# Patient Record
Sex: Female | Born: 1990 | Race: Black or African American | Hispanic: No | Marital: Married | Smoking: Never smoker
Health system: Southern US, Community
[De-identification: ages and names within clinical notes are randomized; demographics above are authoritative.]

## PROBLEM LIST (undated history)

## (undated) DIAGNOSIS — F32A Depression, unspecified: Secondary | ICD-10-CM

## (undated) DIAGNOSIS — F419 Anxiety disorder, unspecified: Secondary | ICD-10-CM

## (undated) DIAGNOSIS — K219 Gastro-esophageal reflux disease without esophagitis: Secondary | ICD-10-CM

## (undated) HISTORY — PX: KNEE SURGERY: SHX244

## (undated) HISTORY — PX: WISDOM TOOTH EXTRACTION: SHX21

---

## 2009-01-20 ENCOUNTER — Emergency Department (HOSPITAL_BASED_OUTPATIENT_CLINIC_OR_DEPARTMENT_OTHER): Admission: EM | Admit: 2009-01-20 | Discharge: 2009-01-20 | Payer: Self-pay | Admitting: Emergency Medicine

## 2011-02-01 ENCOUNTER — Emergency Department (HOSPITAL_BASED_OUTPATIENT_CLINIC_OR_DEPARTMENT_OTHER)
Admission: EM | Admit: 2011-02-01 | Discharge: 2011-02-01 | Disposition: A | Discharge status: Home / Self Care | Payer: No Typology Code available for payment source | Attending: Emergency Medicine | Admitting: Emergency Medicine

## 2011-02-01 ENCOUNTER — Emergency Department (INDEPENDENT_AMBULATORY_CARE_PROVIDER_SITE_OTHER): Payer: No Typology Code available for payment source

## 2011-02-01 ENCOUNTER — Encounter: Payer: Self-pay | Admitting: *Deleted

## 2011-02-01 DIAGNOSIS — R52 Pain, unspecified: Secondary | ICD-10-CM

## 2011-02-01 DIAGNOSIS — Y92410 Unspecified street and highway as the place of occurrence of the external cause: Secondary | ICD-10-CM | POA: Insufficient documentation

## 2011-02-01 DIAGNOSIS — M549 Dorsalgia, unspecified: Secondary | ICD-10-CM

## 2011-02-01 DIAGNOSIS — F17200 Nicotine dependence, unspecified, uncomplicated: Secondary | ICD-10-CM | POA: Insufficient documentation

## 2011-02-01 DIAGNOSIS — M25569 Pain in unspecified knee: Secondary | ICD-10-CM

## 2011-02-01 MED ORDER — HYDROCODONE-ACETAMINOPHEN 5-500 MG PO TABS: 1.0000 | ORAL_TABLET | Freq: Four times a day (QID) | ORAL | Status: AC | PRN

## 2011-02-01 MED ORDER — HYDROCODONE-ACETAMINOPHEN 5-325 MG PO TABS
1.0000 | ORAL_TABLET | Freq: Once | ORAL | Status: AC
  Administered 2011-02-01: 1 via ORAL

## 2011-02-01 MED ORDER — HYDROCODONE-ACETAMINOPHEN 5-325 MG PO TABS
ORAL_TABLET | ORAL | Status: AC
  Filled 2011-02-01: qty 1
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
## 2011-02-02 NOTE — ED Provider Notes (Signed)
Medical screening examination/treatment/procedure(s) were performed by non-physician practitioner and as supervising physician I was immediately available for consultation/collaboration.  Geoffery [place], MD 02/02/11 1537

## 2011-09-15 ENCOUNTER — Encounter (HOSPITAL_BASED_OUTPATIENT_CLINIC_OR_DEPARTMENT_OTHER): Payer: Self-pay | Admitting: *Deleted

## 2011-09-15 ENCOUNTER — Emergency Department (HOSPITAL_BASED_OUTPATIENT_CLINIC_OR_DEPARTMENT_OTHER)
Admission: EM | Admit: 2011-09-15 | Discharge: 2011-09-16 | Disposition: A | Discharge status: Home / Self Care | Payer: 59 | Attending: Emergency Medicine | Admitting: Emergency Medicine

## 2011-09-15 DIAGNOSIS — M7989 Other specified soft tissue disorders: Secondary | ICD-10-CM

## 2011-09-15 DIAGNOSIS — F17200 Nicotine dependence, unspecified, uncomplicated: Secondary | ICD-10-CM | POA: Insufficient documentation

## 2011-09-15 NOTE — ED Notes (Signed)
Pt c/o unable to get ring off finger

## 2011-09-16 NOTE — ED Notes (Signed)
Removed ring with ring cutter per RN

## 2011-09-16 NOTE — Discharge Instructions (Signed)
Place ice on finger as needed for swelling.  RESOURCE GUIDE  Dental Problems  Patients with Medicaid: [facility] [phone]                                           [phone] [address] Phone:  [phone]                                                   Phone:  [phone]  If unable to pay or uninsured, contact:  [facility] or [facility]. to become qualified for the adult dental clinic.  Chronic Pain Problems Contact [facility]  [phone] Patients need to be referred by their primary care doctor.  Insufficient Money for Medicine Contact [employer]:  call "211" or [facility] 7278848390.  No Primary Care Doctor Call [facility]  [phone] Other agencies that provide inexpensive medical care    [facility]  [phone]    [facility]  [phone]    [facility]  [phone]    [facility]  [phone]    [facility]  [phone]    [facility]  [phone]  Psychological Services [facility]  [phone] [facility]  [phone] [facility]   [phone] (emergency services [phone])  Abuse/Neglect [facility] Child Abuse Hotline 212 268 6307 [facility] Child Abuse Hotline [phone] (After Hours)  Emergency Shelter [facility] 7044057457  [facility] [place] [facility] [phone] [facility] [phone]  MRSA Hotline #:   720 390 0933    [facility] Resources  [facility]  [employer]                           [facility] Dept. [address] [place]                     [address]         [address]  Blondell Reveal Phone:  [phone]                                  Phone:   [phone]                   Phone:  [phone]  [facility] Phone:  [phone]  [facility] Child Abuse Hotline [phone] (After Hours)

## 2011-09-16 NOTE — ED Provider Notes (Signed)
History     CSN: [id number]  Arrival date & time 09/15/11  2349   First MD Initiated Contact with Patient 09/16/11 0048      Chief Complaint  Patient presents with  . Ring removal     (Consider location/radiation/quality/duration/timing/severity/associated sxs/prior treatment) HPI  21yof previously healthy presents with left ring finger swelling. The patient states that she placed a ring on her finger and immediately was unable to get it off. Her significant other tried to use pliers to get off. She began to have left ring finger swelling at that point. She denies numbness, tingling, weakness of her fingers. The ring was removed in triage prior to my evaluation. She states that her finger feels better and she is not having any pain at this time.   ED Notes, ED Provider Notes from 09/15/11 0000 to 09/15/11 23:53:38       Hennie Duos, RN 09/15/2011 23:52      Pt c/o unable to get ring off finger    History reviewed. No pertinent past medical history.  Past Surgical History  Procedure Date  . Knee surgery     History reviewed. No pertinent family history.  History  Substance Use Topics  . Smoking status: Current Everyday Smoker  . Smokeless tobacco: Not on file  . Alcohol Use: Yes    OB History    Grav Para Term Preterm Abortions TAB SAB Ect Mult Living                  Review of Systems  All other systems reviewed and are negative.   except as noted HPI   Allergies  Review of patient's allergies indicates no known allergies.  Home Medications  No current outpatient prescriptions on file.  BP 125/95  Pulse 91  Temp 98.3 F (36.8 C) (Oral)  Resp 16  Ht 5' (1.524 m)  Wt 104 lb (47.174 kg)  BMI 20.31 kg/m2  SpO2 100%  LMP 09/01/2011  Physical Exam  Nursing note and vitals reviewed. Constitutional: She is oriented to person, place, and time. She appears well-developed.  HENT:  Head: Atraumatic.  Mouth/Throat: Oropharynx is clear and moist.    Eyes: Conjunctivae and EOM are normal. Pupils are equal, round, and reactive to light.  Neck: Normal range of motion. Neck supple.  Cardiovascular: Normal rate, regular rhythm, normal heart sounds and intact distal pulses.   Pulmonary/Chest: Effort normal and breath sounds normal. No respiratory distress. She has no wheezes. She has no rales.  Abdominal: Soft. She exhibits no distension. There is no tenderness. There is no rebound and no guarding.  Musculoskeletal: Normal range of motion. She exhibits edema.       Ring finger with minimal swelling between PIP and MCP. There is no tenderness to palpation. There is no erythema. Gross sensation intact. Capillary refill less than 3.  Neurological: She is alert and oriented to person, place, and time.  Skin: Skin is warm and dry. No rash noted.  Psychiatric: She has a normal mood and affect.    ED Course  Procedures (including critical care time)  Labs Reviewed - No data to display No results found.   1. Finger swelling     MDM  Ring tourniquet, removed prior to my evaluation. Neurovascularly intact to follow with primary care as needed.        Forbes Cellar, MD 09/16/11 1324

## 2017-07-27 ENCOUNTER — Emergency Department (HOSPITAL_BASED_OUTPATIENT_CLINIC_OR_DEPARTMENT_OTHER): Payer: BLUE CROSS/BLUE SHIELD

## 2017-07-27 ENCOUNTER — Encounter (HOSPITAL_BASED_OUTPATIENT_CLINIC_OR_DEPARTMENT_OTHER): Payer: Self-pay

## 2017-07-27 ENCOUNTER — Emergency Department (HOSPITAL_BASED_OUTPATIENT_CLINIC_OR_DEPARTMENT_OTHER)
Admission: EM | Admit: 2017-07-27 | Discharge: 2017-07-27 | Disposition: A | Discharge status: Home / Self Care | Payer: BLUE CROSS/BLUE SHIELD | Attending: Emergency Medicine | Admitting: Emergency Medicine

## 2017-07-27 ENCOUNTER — Other Ambulatory Visit: Payer: Self-pay

## 2017-07-27 DIAGNOSIS — J0100 Acute maxillary sinusitis, unspecified: Secondary | ICD-10-CM

## 2017-07-27 DIAGNOSIS — M542 Cervicalgia: Secondary | ICD-10-CM | POA: Insufficient documentation

## 2017-07-27 DIAGNOSIS — J029 Acute pharyngitis, unspecified: Secondary | ICD-10-CM | POA: Diagnosis present

## 2017-07-27 DIAGNOSIS — R52 Pain, unspecified: Secondary | ICD-10-CM

## 2017-07-27 LAB — CBC WITH DIFFERENTIAL/PLATELET
BASOS ABS: 0 10*3/uL (ref 0.0–0.1)
Basophils Relative: 0 %
EOS ABS: 0 10*3/uL (ref 0.0–0.7)
EOS PCT: 0 %
HCT: 36.8 % (ref 36.0–46.0)
Hemoglobin: 13.6 g/dL (ref 12.0–15.0)
Lymphocytes Relative: 6 %
Lymphs Abs: 0.3 10*3/uL — ABNORMAL LOW (ref 0.7–4.0)
MCH: 28.6 pg (ref 26.0–34.0)
MCHC: 37 g/dL — ABNORMAL HIGH (ref 30.0–36.0)
MCV: 77.5 fL — ABNORMAL LOW (ref 78.0–100.0)
Monocytes Absolute: 0.4 10*3/uL (ref 0.1–1.0)
Monocytes Relative: 8 %
Neutro Abs: 4.1 10*3/uL (ref 1.7–7.7)
Neutrophils Relative %: 86 %
PLATELETS: 231 10*3/uL (ref 150–400)
RBC: 4.75 MIL/uL (ref 3.87–5.11)
RDW: 13.2 % (ref 11.5–15.5)
WBC: 4.8 10*3/uL (ref 4.0–10.5)

## 2017-07-27 LAB — BASIC METABOLIC PANEL
Anion gap: 9 (ref 5–15)
BUN: 6 mg/dL (ref 6–20)
CO2: 23 mmol/L (ref 22–32)
CREATININE: 0.61 mg/dL (ref 0.44–1.00)
Calcium: 8.9 mg/dL (ref 8.9–10.3)
Chloride: 104 mmol/L (ref 101–111)
GFR calc Af Amer: >60 mL/min (ref >60)
Glucose, Bld: 85 mg/dL (ref 65–99)
Potassium: 3.3 mmol/L — ABNORMAL LOW (ref 3.5–5.1)
SODIUM: 136 mmol/L (ref 135–145)

## 2017-07-27 LAB — URINALYSIS, ROUTINE W REFLEX MICROSCOPIC
Bilirubin Urine: NEGATIVE
Glucose, UA: NEGATIVE mg/dL
KETONES UR: 15 mg/dL — AB
Leukocytes, UA: NEGATIVE
NITRITE: NEGATIVE
PH: 7.5 (ref 5.0–8.0)
Protein, ur: NEGATIVE mg/dL
Specific Gravity, Urine: 1.015 (ref 1.005–1.030)

## 2017-07-27 LAB — MONONUCLEOSIS SCREEN: Mono Screen: NEGATIVE

## 2017-07-27 LAB — URINALYSIS, MICROSCOPIC (REFLEX)

## 2017-07-27 LAB — PREGNANCY, URINE: PREG TEST UR: NEGATIVE

## 2017-07-27 LAB — RAPID STREP SCREEN (MED CTR MEBANE ONLY): STREPTOCOCCUS, GROUP A SCREEN (DIRECT): NEGATIVE

## 2017-07-27 MED ORDER — IBUPROFEN 600 MG PO TABS: 600.0000 mg | ORAL_TABLET | Freq: Four times a day (QID) | ORAL | 0 refills | Status: DC | PRN

## 2017-07-27 MED ORDER — ACETAMINOPHEN 500 MG PO TABS
1000.0000 mg | ORAL_TABLET | Freq: Once | ORAL | Status: AC
  Administered 2017-07-27: 1000 mg via ORAL
  Filled 2017-07-27: qty 2

## 2017-07-27 MED ORDER — GUAIFENESIN ER 1200 MG PO TB12: 1.0000 | ORAL_TABLET | Freq: Two times a day (BID) | ORAL | 1 refills | Status: DC | PRN

## 2017-07-27 MED ORDER — SODIUM CHLORIDE 0.9 % IV BOLUS
1000.0000 mL | Freq: Once | INTRAVENOUS | Status: AC
  Administered 2017-07-27: 1000 mL via INTRAVENOUS

## 2017-07-27 MED ORDER — AMOXICILLIN-POT CLAVULANATE 875-125 MG PO TABS: 1.0000 | ORAL_TABLET | Freq: Two times a day (BID) | ORAL | 0 refills | Status: DC

## 2017-07-27 MED FILL — MUCINEX ER 1,200 MG TABLET: 1200 | 14 days supply | Qty: 28 | Fill #0

## 2017-07-27 MED FILL — AMOX-CLAV 875-125 MG TABLET: 875-125 | 7 days supply | Qty: 14 | Fill #0

## 2017-07-27 MED FILL — IBUPROFEN 600 MG TABLET: 600 | 8 days supply | Qty: 30 | Fill #0

## 2017-07-27 NOTE — Discharge Instructions (Signed)
You lab work and chest xray are reassuring.  Please take all of your antibiotics until finished!   You may develop abdominal discomfort or diarrhea from the antibiotic.  You may help offset this with probiotics which you can buy or get in yogurt. Do not eat or take the probiotics until 2 hours after your antibiotic. Do not take your medicine if develop an itchy rash, swelling in your mouth or lips, or difficulty breathing.  Please follow up with your PCP this week.  If you develop any rash, stiff neck worsening or new concerning symptoms you can return to the emergency department for re-evaluation.

## 2017-07-27 NOTE — ED Triage Notes (Signed)
Pt c/o bodyaches, sore throat, chest/back pain, headache x2wks; saw a doctor on Monday and did a chest/back xray and they are testing for fibromyalgia

## 2017-07-27 NOTE — ED Provider Notes (Signed)
[facility] EMERGENCY DEPARTMENT Provider Note   CSN: [id number] Arrival date & time: 07/27/17  1009     History   Chief Complaint Chief Complaint  Patient presents with  . Generalized Body Aches    HPI Morgan Sanchez is a 27 y.o. female with no significant past medical history presents emergency department today for nasal congestion, sinus pressure, sore throat, cough and generalized body aches.  Patient notes that over the last 2 weeks she has had generalized body aches, feeling more tired as well as sore throat.  She notes that over the last 1.5 weeks she has been developing nasal congestion, sinus pressure, worsening sore throat with associated dysphasia, sneezing as well as now a dry, nonproductive cough.  She was seen by her PCP and had a chest x-ray that was negative.  She was set up to have blood work for fibromyalgia.  She notes that she has been taking ibuprofen as well as Alka-Seltzer Liqui-Gels for her symptoms without relief.  No antipyretics prior to arrival.  She notes she started running a fever over the last 2-3 days, with a T-max of 101.  She reports that she has not been outside recently and denies any tick bites.  She does not have animals at home that could transport to extend side.  She denies any headache, focal weakness, rash, neck stiffness, inability to control secretions, chest pain, shortness of breath, hemoptysis, lower leg swelling, abdominal pain, nausea/vomiting/diarrhea, urinary frequency, urinary urgency, dysuria, hematuria.  HPI  History reviewed. No pertinent past medical history.  There are no active problems to display for this patient.   Past Surgical History:  Procedure Laterality Date  . KNEE SURGERY       OB History   None      Home Medications    Prior to Admission medications   Not on File    Family History No family history on file.  Social History Social History   Tobacco Use  . Smoking status: Never Smoker  .  Smokeless tobacco: Never Used  Substance Use Topics  . Alcohol use: Yes  . Drug use: No     Allergies   Patient has no known allergies.   Review of Systems Review of Systems  All other systems reviewed and are negative.    Physical Exam Updated Vital Signs BP (!) 143/88 (BP Location: Right Arm)   Pulse 84   Temp 98.8 F (37.1 C) (Oral)   Resp 18   LMP 07/20/2017   SpO2 100%   Physical Exam  Constitutional: She appears well-developed and well-nourished.  HENT:  Head: Normocephalic and atraumatic.  Right Ear: Tympanic membrane and external ear normal.  Left Ear: Tympanic membrane and external ear normal.  Nose: Mucosal edema present. Right sinus exhibits maxillary sinus tenderness. Right sinus exhibits no frontal sinus tenderness. Left sinus exhibits maxillary sinus tenderness. Left sinus exhibits no frontal sinus tenderness.  Mouth/Throat: Uvula is midline, oropharynx is clear and moist and mucous membranes are normal. No tonsillar exudate.  The patient has normal phonation and is in control of secretions. No stridor.  Midline uvula without edema. Soft palate rises symmetrically. Tonsillar erythema without exudates. No PTA. Tongue protrusion is normal. No trismus. No creptius on neck palpation and patient has good dentition. No gingival erythema or fluctuance noted. Mucus membranes moist.   Eyes: Pupils are equal, round, and reactive to light. Right eye exhibits no discharge. Left eye exhibits no discharge. No scleral icterus.  Neck: Trachea normal.  Neck supple. No JVD present. No spinous process tenderness present. Carotid bruit is not present. No neck rigidity. Normal range of motion present.  No nuchal rigidity or meningismus  Cardiovascular: Normal rate, regular rhythm and intact distal pulses.  No murmur heard. Pulses:      Radial pulses are 2+ on the right side, and 2+ on the left side.       Dorsalis pedis pulses are 2+ on the right side, and 2+ on the left side.        Posterior tibial pulses are 2+ on the right side, and 2+ on the left side.  No lower extremity swelling or edema. Calves symmetric in size bilaterally.  Pulmonary/Chest: Effort normal and breath sounds normal. She exhibits no tenderness.  No increased work of breathing. No accessory muscle use. Patient is sitting upright, speaking in full sentences without difficulty   Abdominal: Soft. Bowel sounds are normal. She exhibits no distension. There is no tenderness. There is no rigidity, no rebound, no guarding and no CVA tenderness.  Musculoskeletal: She exhibits no edema.  Lymphadenopathy:    She has cervical adenopathy (anterior).  Neurological: She is alert.  Speech clear. Follows commands. No facial droop. PERRLA. EOM grossly intact. CN III-XII grossly intact. Grossly moves all extremities 4 without ataxia. Able and appropriate strength for age to upper and lower extremities bilaterally.   Skin: Skin is warm and dry. No petechiae, no purpura and no rash noted. She is not diaphoretic.  Psychiatric: She has a normal mood and affect.  Nursing note and vitals reviewed.    ED Treatments / Results  Labs (all labs ordered are listed, but only abnormal results are displayed) Labs Reviewed  BASIC METABOLIC PANEL - Abnormal; Notable for the following components:      Result Value   Potassium 3.3 (*)    All other components within normal limits  CBC WITH DIFFERENTIAL/PLATELET - Abnormal; Notable for the following components:   MCV 77.5 (*)    MCHC 37.0 (*)    Lymphs Abs 0.3 (*)    All other components within normal limits  URINALYSIS, ROUTINE W REFLEX MICROSCOPIC - Abnormal; Notable for the following components:   Hgb urine dipstick SMALL (*)    Ketones, ur 15 (*)    All other components within normal limits  URINALYSIS, MICROSCOPIC (REFLEX) - Abnormal; Notable for the following components:   Bacteria, UA FEW (*)    All other components within normal limits  RAPID STREP SCREEN ([facility] & [facility]  ONLY)  CULTURE, GROUP A STREP [facility])  MONONUCLEOSIS SCREEN  PREGNANCY, URINE    EKG None  Radiology Dg Chest 2 View  Result Date: 07/27/2017 CLINICAL DATA:  Body aches.  Sore throat.  Chest and back pain. EXAM: CHEST - 2 VIEW COMPARISON:  None. FINDINGS: The heart size and mediastinal contours are within normal limits. Both lungs are clear. The visualized skeletal structures are unremarkable. IMPRESSION: No active cardiopulmonary disease. Electronically Signed   By: San Morelle M.D.   On: 07/27/2017 12:05    Procedures Procedures (including critical care time)  Medications Ordered in ED Medications  sodium chloride 0.9 % bolus 1,000 mL (has no administration in time range)  acetaminophen (TYLENOL) tablet 1,000 mg (has no administration in time range)     Initial Impression / Assessment and Plan / ED Course  I have reviewed the triage vital signs and the nursing notes.  Pertinent labs & imaging results that were available during my care of the  patient were reviewed by me and considered in my medical decision making (see chart for details).     27 y.o. female with no significant past medical history presents emergency department today for fever, nasal congestion, sinus pressure, sore throat, non-productive cough and generalized body aches that have been ongoing for the last several weeks. Patient is noted to have fever and mild tachycardia on arrival. This improved after tylenol and ivf.  Patient ear exam without evidence of AOM. No meningeal signs  No evidence of mastoiditis. Patient does have significant mucosal edema and sinus pressure. Oropharynx is with mild tonsillar erythema but no exudates. Strep and mono negative. Do not suspect PTA or RPA. Patient without meningeal signs, ha, neurologic symptoms or rash. Do not suspect meningitis. Lungs are clear to auscultation bilaterally. Xray without PNA.   Abdomen is soft, nondistended and without tenderness. Do not suspect  intra-abdominal pathology. Patient UA without evidence of UTI. Screening labs otherwise reassuring as above. Patient denies tick bites, rash, outdoor activities or animal in house that could transfer ticks that would make me concerned for RMSF. Concern for acute bacterial rhinosinusitis.  Patient discharged with Augmentin.  Instructions given for warm saline nasal wash given. I advised the patient to follow-up with PCP this week. Specific return precautions discussed. Time was given for all questions to be answered. The patient verbalized understanding and agreement with plan. The patient appears safe for discharge home.  Final Clinical Impressions(s) / ED Diagnoses   Final diagnoses:  Acute non-recurrent maxillary sinusitis    ED Discharge Orders        Ordered    amoxicillin-clavulanate (AUGMENTIN) 875-125 MG tablet  Every 12 hours     07/27/17 1237    Guaifenesin (MUCINEX MAXIMUM STRENGTH) 1200 MG TB12  Every 12 hours PRN     07/27/17 1237    ibuprofen (ADVIL,MOTRIN) 600 MG tablet  Every 6 hours PRN     07/27/17 1237       Lorelle Gibbs 07/27/17 1238    Isla Pence, MD 07/27/17 1248

## 2017-07-30 LAB — CULTURE, GROUP A STREP (THRC)

## 2020-12-23 ENCOUNTER — Inpatient Hospital Stay (HOSPITAL_COMMUNITY): Payer: BC Managed Care – PPO

## 2020-12-23 ENCOUNTER — Other Ambulatory Visit: Payer: Self-pay

## 2020-12-23 ENCOUNTER — Encounter (HOSPITAL_COMMUNITY): Payer: Self-pay | Admitting: Obstetrics and Gynecology

## 2020-12-23 ENCOUNTER — Inpatient Hospital Stay (HOSPITAL_COMMUNITY)
Admission: AD | Admit: 2020-12-23 | Discharge: 2020-12-23 | Disposition: A | Discharge status: Home / Self Care | Payer: BC Managed Care – PPO | Attending: Obstetrics and Gynecology | Admitting: Obstetrics and Gynecology

## 2020-12-23 DIAGNOSIS — Z3A01 Less than 8 weeks gestation of pregnancy: Secondary | ICD-10-CM | POA: Diagnosis not present

## 2020-12-23 DIAGNOSIS — O210 Mild hyperemesis gravidarum: Secondary | ICD-10-CM | POA: Insufficient documentation

## 2020-12-23 DIAGNOSIS — O219 Vomiting of pregnancy, unspecified: Secondary | ICD-10-CM

## 2020-12-23 DIAGNOSIS — Z3A00 Weeks of gestation of pregnancy not specified: Secondary | ICD-10-CM

## 2020-12-23 DIAGNOSIS — Z3200 Encounter for pregnancy test, result unknown: Secondary | ICD-10-CM

## 2020-12-23 HISTORY — DX: Depression, unspecified: F32.A

## 2020-12-23 HISTORY — DX: Anxiety disorder, unspecified: F41.9

## 2020-12-23 LAB — URINALYSIS, ROUTINE W REFLEX MICROSCOPIC
Bilirubin Urine: NEGATIVE
Glucose, UA: NEGATIVE mg/dL
Hgb urine dipstick: NEGATIVE
Ketones, ur: 80 mg/dL — AB
Leukocytes,Ua: NEGATIVE
Nitrite: NEGATIVE
Protein, ur: NEGATIVE mg/dL
Specific Gravity, Urine: 1.016 (ref 1.005–1.030)
pH: 5 (ref 5.0–8.0)

## 2020-12-23 LAB — POCT PREGNANCY, URINE: Preg Test, Ur: POSITIVE — AB

## 2020-12-23 MED ORDER — SODIUM CHLORIDE 0.9 % IV SOLN
8.0000 mg | Freq: Once | INTRAVENOUS | Status: AC
  Administered 2020-12-23: 8 mg via INTRAVENOUS
  Filled 2020-12-23: qty 4

## 2020-12-23 MED ORDER — LACTATED RINGERS IV BOLUS
1000.0000 mL | Freq: Once | INTRAVENOUS | Status: AC
  Administered 2020-12-23: 1000 mL via INTRAVENOUS

## 2020-12-23 MED ORDER — ONDANSETRON HCL 4 MG PO TABS
4.0000 mg | ORAL_TABLET | Freq: Three times a day (TID) | ORAL | 6 refills | Status: DC | PRN
Start: 2020-12-23 — End: 2021-08-19

## 2020-12-23 MED ORDER — M.V.I. ADULT IV INJ
Freq: Once | INTRAVENOUS | Status: AC
  Filled 2020-12-23: qty 10

## 2020-12-23 NOTE — Progress Notes (Signed)
Dr Garwin Brothers in earlier to discuss d/c plan with pt. Written and verbal d/c instructions given and understanding voiced.

## 2020-12-23 NOTE — MAU Provider Note (Signed)
Event Date/Time   First Provider Initiated Contact with Patient 12/23/20 1556      S [name] is a 30 y.o. G1P0 at [redacted]w[redacted]d gestation by certain LMP of 07/29/2021 female who presents to MAU today with complaint of feeling dehydrated, not being able to hold anything down since Friday and weakness. She is a patient of Dr. Garwin Brothers' and has already been seen for this pregnancy. She does not have any anti-emetic medications at home. She reports last eating mashed potatoes at 0900 this morning and was able to keep them down. She drank something about 1000 this morning, but was not able to keep that down.  O BP 116/61 (BP Location: Right Arm)   Pulse 85   Temp 98.1 F (36.7 C) (Oral)   Resp 19   Ht 5\' 1"  (1.549 m)   Wt 64.7 kg   LMP 11/02/2020 (Exact Date)   SpO2 100%   BMI 26.94 kg/m   Physical Exam Constitutional:      Appearance: She is normal weight.  Pulmonary:     Effort: Pulmonary effort is normal.  Genitourinary:    Comments: Not indicated Musculoskeletal:     Cervical back: Normal range of motion.  Neurological:     Mental Status: She is alert and oriented to person, place, and time.  Psychiatric:        Mood and Affect: Mood normal.        Behavior: Behavior normal.        Thought Content: Thought content normal.        Judgment: Judgment normal.   Results for orders placed or performed during the hospital encounter of 12/23/20 (from the past 24 hour(s))  Pregnancy, urine POC     Status: Abnormal   Collection Time: 12/23/20  3:59 PM  Result Value Ref Range   Preg Test, Ur POSITIVE (A) NEGATIVE    A Medical screening exam complete Morning sickness - [facility] - UPT - RN awaiting contact from Dr. Garwin Brothers for further orders  Morgan Sanchez, [place] 12/23/2020 4:27 PM

## 2020-12-23 NOTE — Progress Notes (Signed)
No answer, LVM.

## 2020-12-23 NOTE — MAU Note (Signed)
History     Chief Complaint  Patient presents with   Emesis   Nausea   30 yo G1P0 MBF ~ [redacted] week gestation presents with c/o inability to keep anything down since  Friday. Denies vaginal  bleeding or abdominal pain  OB History     Gravida  1   Para      Term      Preterm      AB      Living  0      SAB      IAB      Ectopic      Multiple      Live Births  0           Past Medical History:  Diagnosis Date   Anxiety    Depression     Past Surgical History:  Procedure Laterality Date   KNEE SURGERY      Family History  Problem Relation Age of Onset   Healthy Mother    Healthy Father     Social History   Tobacco Use   Smoking status: Never   Smokeless tobacco: Never  Vaping Use   Vaping Use: Never used  Substance Use Topics   Alcohol use: Not Currently   Drug use: No    Allergies: No Known Allergies  Medications Prior to Admission  Medication Sig Dispense Refill Last Dose   Prenatal Vit-Fe Fumarate-FA (MULTIVITAMIN-PRENATAL) 27-0.8 MG TABS tablet Take 1 tablet by mouth daily at 12 noon.   12/22/2020   amoxicillin-clavulanate (AUGMENTIN) 875-125 MG tablet Take 1 tablet by mouth every 12 (twelve) hours. 14 tablet 0    Guaifenesin (MUCINEX MAXIMUM STRENGTH) 1200 MG TB12 Take 1 tablet (1,200 mg total) by mouth every 12 (twelve) hours as needed. 14 tablet 1    ibuprofen (ADVIL,MOTRIN) 600 MG tablet Take 1 tablet (600 mg total) by mouth every 6 (six) hours as needed. 30 tablet 0      Physical Exam   Blood pressure 116/61, pulse 85, temperature 98.1 F (36.7 C), temperature source Oral, resp. rate 19, height 5\' 1"  (1.549 m), weight 64.7 kg, last menstrual period 11/02/2020, SpO2 100 %.  General appearance: alert, cooperative, and no distress Lungs: clear to auscultation bilaterally Breasts:  bilaterally tender, dense Heart: regular rate and rhythm, S1, S2 normal, no murmur, click, rub or gallop Abdomen:  soft non tender Pelvic deferred ED  Course  IMP: n/v in pregnancy P) IVF ( 2L). Zofran 8mg  IV 1st bag. 1 amp MVI with 2nd bag. Sonogram confirm viability MDM  Addendum US OB Comp Less 14 Wks  Result Date: 12/23/2020 CLINICAL DATA:  Nausea vomiting EXAM: OBSTETRIC <14 WK ULTRASOUND TECHNIQUE: Transabdominal ultrasound was performed for evaluation of the gestation as well as the maternal uterus and adnexal regions. COMPARISON:  None. FINDINGS: Intrauterine gestational sac: Single Yolk sac:  Visualized Embryo:  Visualized Cardiac Activity: Visualized Heart Rate: 135 bpm CRL:   8.3 mm   6 w 5 d                  [place] EDC: 08/13/2021 Subchorionic hemorrhage:  None visualized. Maternal uterus/adnexae: Nonvisualized left ovary. Right ovary measures 5 x 3.7 by 3.9 cm. IMPRESSION: Single viable intrauterine pregnancy with estimated sonographic age of 6 weeks 5 days and ultrasound EDC of 08/13/2021. Otherwise no specific abnormality is seen. Electronically Signed   By: Donavan Foil M.D.   On: 12/23/2020 17:55     Reviewed sonogram with pt  Able to tolerate crackers, juice P) d/c home. Reviewed food options, disc freq small meals Call office regarding appt Marvene Staff, MD 6:59 PM 12/23/2020

## 2020-12-23 NOTE — Progress Notes (Signed)
RN called MD again, no answer, LVM.

## 2020-12-23 NOTE — MAU Note (Signed)
Presents stating she's dehydrated because unable to keep anything down.  Reports N/V since Friday.

## 2021-01-19 LAB — OB RESULTS CONSOLE ANTIBODY SCREEN: Antibody Screen: NEGATIVE

## 2021-01-19 LAB — OB RESULTS CONSOLE GC/CHLAMYDIA
Chlamydia: NEGATIVE
Gonorrhea: NEGATIVE

## 2021-01-19 LAB — HEPATITIS C ANTIBODY: HCV Ab: NEGATIVE

## 2021-01-19 LAB — OB RESULTS CONSOLE ABO/RH: RH Type: POSITIVE

## 2021-01-19 LAB — OB RESULTS CONSOLE RUBELLA ANTIBODY, IGM: Rubella: IMMUNE

## 2021-01-19 LAB — OB RESULTS CONSOLE RPR: RPR: NONREACTIVE

## 2021-01-19 LAB — OB RESULTS CONSOLE HIV ANTIBODY (ROUTINE TESTING): HIV: NONREACTIVE

## 2021-01-19 LAB — OB RESULTS CONSOLE HEPATITIS B SURFACE ANTIGEN: Hepatitis B Surface Ag: NEGATIVE

## 2021-03-29 NOTE — L&D Delivery Note (Signed)
Vaginal Delivery Note - w/PPH  This G1, P0 with EDC 08/14/21 presented with SROM about 7.30 yesterday morning. Her prenatal care complicated by uterine fibroids, hemoglobin C trait, sialorrhea, IBS & anxiety. GBS negative. She was a transfer of care at 20 weeks. Exam upon admission: 2 tand by report pool of fluid was positive for Nitrazine and positive ferning.  She required augmentation with Pitocin to achieve a good active phase. AROM of forebag was performed and thin meconium was present. She achieved complete cervical dilation at 0438 on 08/15/21. Patient started pushing at 05:15. She pushed for approximately one hour after she was noted to be C/C/+2. Guided pushing with maternal urge and regular contractions. At 6:18 AM a viable and healthy female was delivered via Vaginal, Spontaneous (Presentation:Occiput Anterior).  APGAR: 7, 9; weight 7 lb 1.2 oz (3210 g).  After head was delivered no nuchal cord was identified and shoulders and body easily delivered.  Baby held head down to prevent inhalation of meconium stained amniotic fluid. Nose and mouth bulb suctioned thoroughly. Baby then laid on maternal abdomen, dried and tactile stimulation performed. Baby noted to have a vigorous cry and moving all four extremities. Delayed cord clamping done and cord cut by father. Cord blood obtained.   Pitocin bolus infused via pump protocol. Placenta spontaneously delivered complete and intact with fundal massage and general traction on the cord. Three vessels are noted. Fundus required bimanual massage in a couple of occasions for recurrent lower uterine segment atony with several larger clots.  Uterine sweep performed removing more clots. As the Pitocin infused and massage continued, the uterine tone improved significantly. EBL at this time was about 500 mL. 1 dose of Tranexamic acid administered and 1000 mcg of misoprostol administered per rectum prophylactically and one dose of 3 Grams of IV Unasyn (for uterine  sweep).   The uterine fundus appropriately firmed,however there was persistent bright red bleeding. Exam revealed a significant second-degree tear ascended a little bit up higher in the vagina and a little off to the right side but rectum sphincter were intact, although I cannot see good fascia around the sphincter anteriorly. The cervix was inspected and there was no laceration. A rectal examination was performed and the rectal sphincter was intact. Upon inspection a second degree laceration was identified as well as brisk bleeding from a left sulcus tear. Attempt was made to place sutures of #2 Vicryl Rapide along the line of the sulcal tear, however patient had acute bout of nausea with vomiting. The sutures began tearing through the friable mucosal tissue. 10 mg of IV metoclopramide administered to aid with the N/V.   Due to the persistent bleeding a postpartum hemorrhage was called. RN staff informed via I-SBAR that it was stage I.  qBL scale (Triton) was requested to be brought in.  Foley urethral catheter was re-placed for urinary output monitoring. Surgical tech provided additional lap pads. A running, locked suture of 2-0 Chromic was used to repair the bleeding sulcal tear and bleeding was controlled. Patient remained hemodynamically stable, and was alert, awake and oriented throughout.   The perineum was infiltrated with 1% Xylocaine. The remaining second degree laceration was repaired in routine fashion with 2-0 Vicryl, 2-0 Chromic and 3-0 Chromic.   Patient tolerated delivery well.Once this was complete, mom and baby doing well.    Delivery Details: Delivery Type: NSVD  Anesthesia  Epidural  and local 1% lidocaine  Episiotomy:  N/A  Lacerations:  Second degree perineal / left sulcus extending  to vaginal wall  Repair suture:  2-0 vicryl Rapide CT-1 (x3); 3-0-Chromic CT 1 (x1) and SH x1  Blood loss (ml):  930   Birth information: Date of birth:   [birth year]  Time of birth: 06:18  Sex:  Information for the patient's newborn:  Stauffer, Girl Erva [[id number]]  female    Name: "Lavendar Skye"  APGAR APGAR (1 MIN): 7   APGAR (5 MINS): 9   APGAR (10 MINS):    Weight  3210 grams (7# 1.2 oz)   Resuscitation:   Drying, stimulation, bulb suction  Cord information: 3 vessel cord Complications:     PPH  Placenta: Delivered: Spontaneous intact appearance: Normal     Disposition: Mom to postpartum.  Baby to Couplet care / Skin to Skin.  Sanjuana Kava MD 08/15/2021, 9:05 AM

## 2021-07-06 DIAGNOSIS — D250 Submucous leiomyoma of uterus: Secondary | ICD-10-CM | POA: Diagnosis not present

## 2021-07-06 DIAGNOSIS — Z3A34 34 weeks gestation of pregnancy: Secondary | ICD-10-CM | POA: Diagnosis not present

## 2021-07-06 DIAGNOSIS — Z3403 Encounter for supervision of normal first pregnancy, third trimester: Secondary | ICD-10-CM | POA: Diagnosis not present

## 2021-07-21 DIAGNOSIS — Z3403 Encounter for supervision of normal first pregnancy, third trimester: Secondary | ICD-10-CM | POA: Diagnosis not present

## 2021-07-21 LAB — OB RESULTS CONSOLE GBS: GBS: NEGATIVE

## 2021-08-04 DIAGNOSIS — Z3A38 38 weeks gestation of pregnancy: Secondary | ICD-10-CM | POA: Diagnosis not present

## 2021-08-04 DIAGNOSIS — D259 Leiomyoma of uterus, unspecified: Secondary | ICD-10-CM | POA: Diagnosis not present

## 2021-08-10 ENCOUNTER — Telehealth (HOSPITAL_COMMUNITY): Payer: Self-pay | Admitting: *Deleted

## 2021-08-10 ENCOUNTER — Encounter (HOSPITAL_COMMUNITY): Payer: Self-pay | Admitting: *Deleted

## 2021-08-10 ENCOUNTER — Other Ambulatory Visit: Payer: Self-pay | Admitting: Obstetrics & Gynecology

## 2021-08-10 NOTE — Telephone Encounter (Signed)
Preadmission screen  

## 2021-08-14 ENCOUNTER — Inpatient Hospital Stay (HOSPITAL_COMMUNITY): Payer: BC Managed Care – PPO | Admitting: Anesthesiology

## 2021-08-14 ENCOUNTER — Inpatient Hospital Stay (HOSPITAL_COMMUNITY)
Admission: AD | Admit: 2021-08-14 | Discharge: 2021-08-19 | DRG: 806 | Disposition: A | Discharge status: Home / Self Care | Payer: BC Managed Care – PPO | Attending: Obstetrics & Gynecology | Admitting: Obstetrics & Gynecology

## 2021-08-14 ENCOUNTER — Encounter (HOSPITAL_COMMUNITY): Payer: Self-pay | Admitting: Obstetrics & Gynecology

## 2021-08-14 ENCOUNTER — Other Ambulatory Visit: Payer: Self-pay

## 2021-08-14 DIAGNOSIS — D259 Leiomyoma of uterus, unspecified: Secondary | ICD-10-CM | POA: Diagnosis present

## 2021-08-14 DIAGNOSIS — O3413 Maternal care for benign tumor of corpus uteri, third trimester: Secondary | ICD-10-CM | POA: Diagnosis present

## 2021-08-14 DIAGNOSIS — O480 Post-term pregnancy: Secondary | ICD-10-CM

## 2021-08-14 DIAGNOSIS — O1420 HELLP syndrome (HELLP), unspecified trimester: Secondary | ICD-10-CM | POA: Diagnosis not present

## 2021-08-14 DIAGNOSIS — O164 Unspecified maternal hypertension, complicating childbirth: Secondary | ICD-10-CM | POA: Diagnosis not present

## 2021-08-14 DIAGNOSIS — O4292 Full-term premature rupture of membranes, unspecified as to length of time between rupture and onset of labor: Principal | ICD-10-CM | POA: Diagnosis present

## 2021-08-14 DIAGNOSIS — O1424 HELLP syndrome, complicating childbirth: Secondary | ICD-10-CM | POA: Diagnosis not present

## 2021-08-14 DIAGNOSIS — Z3A40 40 weeks gestation of pregnancy: Secondary | ICD-10-CM

## 2021-08-14 DIAGNOSIS — O1414 Severe pre-eclampsia complicating childbirth: Secondary | ICD-10-CM | POA: Diagnosis present

## 2021-08-14 DIAGNOSIS — O1425 HELLP syndrome, complicating the puerperium: Secondary | ICD-10-CM | POA: Diagnosis not present

## 2021-08-14 DIAGNOSIS — D509 Iron deficiency anemia, unspecified: Secondary | ICD-10-CM | POA: Diagnosis not present

## 2021-08-14 DIAGNOSIS — O9902 Anemia complicating childbirth: Secondary | ICD-10-CM | POA: Diagnosis present

## 2021-08-14 DIAGNOSIS — O1493 Unspecified pre-eclampsia, third trimester: Secondary | ICD-10-CM | POA: Diagnosis not present

## 2021-08-14 HISTORY — DX: Gastro-esophageal reflux disease without esophagitis: K21.9

## 2021-08-14 LAB — CBC
HCT: 36.6 % (ref 36.0–46.0)
HCT: 37.5 % (ref 36.0–46.0)
Hemoglobin: 13 g/dL (ref 12.0–15.0)
Hemoglobin: 12.8 g/dL (ref 12.0–15.0)
MCH: 28.1 pg (ref 26.0–34.0)
MCH: 27.4 pg (ref 26.0–34.0)
MCHC: 35.5 g/dL (ref 30.0–36.0)
MCHC: 34.1 g/dL (ref 30.0–36.0)
MCV: 79 fL — ABNORMAL LOW (ref 80.0–100.0)
MCV: 80.1 fL (ref 80.0–100.0)
Platelets: 148 10*3/uL — ABNORMAL LOW (ref 150–400)
Platelets: 156 10*3/uL (ref 150–400)
RBC: 4.63 MIL/uL (ref 3.87–5.11)
RBC: 4.68 MIL/uL (ref 3.87–5.11)
RDW: 17.7 % — ABNORMAL HIGH (ref 11.5–15.5)
RDW: 18 % — ABNORMAL HIGH (ref 11.5–15.5)
WBC: 6.5 10*3/uL (ref 4.0–10.5)
WBC: 6.7 10*3/uL (ref 4.0–10.5)
nRBC: 0 % (ref 0.0–0.2)
nRBC: 0 % (ref 0.0–0.2)

## 2021-08-14 LAB — PROTEIN / CREATININE RATIO, URINE
Creatinine, Urine: 68.66 mg/dL
Protein Creatinine Ratio: 0.13 mg/mg{Cre} (ref 0.00–0.15)
Total Protein, Urine: 9 mg/dL

## 2021-08-14 LAB — COMPREHENSIVE METABOLIC PANEL
ALT: 15 U/L (ref 0–44)
AST: 28 U/L (ref 15–41)
Albumin: 3 g/dL — ABNORMAL LOW (ref 3.5–5.0)
Alkaline Phosphatase: 245 U/L — ABNORMAL HIGH (ref 38–126)
Anion gap: 10 (ref 5–15)
BUN: 5 mg/dL — ABNORMAL LOW (ref 6–20)
CO2: 19 mmol/L — ABNORMAL LOW (ref 22–32)
Calcium: 9.4 mg/dL (ref 8.9–10.3)
Chloride: 108 mmol/L (ref 98–111)
Creatinine, Ser: 0.66 mg/dL (ref 0.44–1.00)
GFR, Estimated: >60 mL/min (ref >60)
Glucose, Bld: 78 mg/dL (ref 70–99)
Potassium: 3.6 mmol/L (ref 3.5–5.1)
Sodium: 137 mmol/L (ref 135–145)
Total Bilirubin: 0.6 mg/dL (ref 0.3–1.2)
Total Protein: 6.8 g/dL (ref 6.5–8.1)

## 2021-08-14 LAB — TYPE AND SCREEN
ABO/RH(D): O POS
Antibody Screen: NEGATIVE

## 2021-08-14 LAB — RPR: RPR Ser Ql: NONREACTIVE

## 2021-08-14 LAB — POCT FERN TEST: POCT Fern Test: POSITIVE

## 2021-08-14 MED ORDER — PHENYLEPHRINE 80 MCG/ML (10ML) SYRINGE FOR IV PUSH (FOR BLOOD PRESSURE SUPPORT)
80.0000 ug | PREFILLED_SYRINGE | INTRAVENOUS | Status: DC | PRN
  Administered 2021-08-14: 80 ug via INTRAVENOUS
  Filled 2021-08-14: qty 10

## 2021-08-14 MED ORDER — MISOPROSTOL 50MCG HALF TABLET
50.0000 ug | ORAL_TABLET | ORAL | Status: DC | PRN
  Administered 2021-08-14: 50 ug via BUCCAL
  Filled 2021-08-14: qty 1

## 2021-08-14 MED ORDER — EPHEDRINE 5 MG/ML INJ: 10.0000 mg | INTRAVENOUS | Status: DC | PRN

## 2021-08-14 MED ORDER — TERBUTALINE SULFATE 1 MG/ML IJ SOLN: 0.2500 mg | Freq: Once | INTRAMUSCULAR | Status: DC | PRN

## 2021-08-14 MED ORDER — ACETAMINOPHEN 325 MG PO TABS: 650.0000 mg | ORAL_TABLET | ORAL | Status: DC | PRN

## 2021-08-14 MED ORDER — LIDOCAINE HCL (PF) 1 % IJ SOLN
INTRAMUSCULAR | Status: DC | PRN
  Administered 2021-08-14: 10 mL via EPIDURAL

## 2021-08-14 MED ORDER — LACTATED RINGERS IV SOLN: INTRAVENOUS | Status: DC

## 2021-08-14 MED ORDER — PHENYLEPHRINE 80 MCG/ML (10ML) SYRINGE FOR IV PUSH (FOR BLOOD PRESSURE SUPPORT)
80.0000 ug | PREFILLED_SYRINGE | INTRAVENOUS | Status: DC | PRN
Start: 2021-08-14 — End: 2021-08-15

## 2021-08-14 MED ORDER — DIPHENHYDRAMINE HCL 50 MG/ML IJ SOLN: 12.5000 mg | INTRAMUSCULAR | Status: DC | PRN

## 2021-08-14 MED ORDER — OXYTOCIN-SODIUM CHLORIDE 30-0.9 UT/500ML-% IV SOLN
2.5000 [IU]/h | INTRAVENOUS | Status: DC
  Administered 2021-08-15 (×2): 2.5 [IU]/h via INTRAVENOUS
  Filled 2021-08-14: qty 500

## 2021-08-14 MED ORDER — OXYTOCIN-SODIUM CHLORIDE 30-0.9 UT/500ML-% IV SOLN
1.0000 m[IU]/min | INTRAVENOUS | Status: DC
  Administered 2021-08-14: 2 m[IU]/min via INTRAVENOUS
  Administered 2021-08-15: 10 m[IU]/min via INTRAVENOUS
  Filled 2021-08-14: qty 500

## 2021-08-14 MED ORDER — SOD CITRATE-CITRIC ACID 500-334 MG/5ML PO SOLN: 30.0000 mL | ORAL | Status: DC | PRN

## 2021-08-14 MED ORDER — OXYTOCIN BOLUS FROM INFUSION
333.0000 mL | Freq: Once | INTRAVENOUS | Status: AC
  Administered 2021-08-15: 333 mL via INTRAVENOUS

## 2021-08-14 MED ORDER — ONDANSETRON HCL 4 MG/2ML IJ SOLN
4.0000 mg | Freq: Four times a day (QID) | INTRAMUSCULAR | Status: DC | PRN
  Administered 2021-08-14 – 2021-08-15 (×2): 4 mg via INTRAVENOUS
  Filled 2021-08-14 (×2): qty 2

## 2021-08-14 MED ORDER — LIDOCAINE HCL (PF) 1 % IJ SOLN
30.0000 mL | INTRAMUSCULAR | Status: AC | PRN
  Administered 2021-08-15: 30 mL via SUBCUTANEOUS
  Filled 2021-08-14: qty 30

## 2021-08-14 MED ORDER — MISOPROSTOL 25 MCG QUARTER TABLET: 25.0000 ug | ORAL_TABLET | ORAL | Status: DC | PRN

## 2021-08-14 MED ORDER — LACTATED RINGERS IV SOLN
500.0000 mL | INTRAVENOUS | Status: DC | PRN
  Administered 2021-08-14 – 2021-08-15 (×2): 500 mL via INTRAVENOUS
  Administered 2021-08-15: 1000 mL via INTRAVENOUS

## 2021-08-14 MED ORDER — LACTATED RINGERS IV SOLN
500.0000 mL | Freq: Once | INTRAVENOUS | Status: AC
  Administered 2021-08-14: 500 mL via INTRAVENOUS

## 2021-08-14 MED ORDER — FENTANYL-BUPIVACAINE-NACL 0.5-0.125-0.9 MG/250ML-% EP SOLN
12.0000 mL/h | EPIDURAL | Status: DC | PRN
  Administered 2021-08-14: 12 mL/h via EPIDURAL
  Filled 2021-08-14: qty 250

## 2021-08-14 NOTE — MAU Note (Signed)
...  Morgan Sanchez is a 31 y.o. at 55w1dhere in MAU reporting: LOF since 0730 this morning that is clear. She reports the fluid had a "musty" smell. She states she has been losing her mucous plug over the past couple of days that has had small streaks of blood mixed in. She reports CTX every 15 minutes. +FM.   Onset of complaint: Today Pain score: 1/10 lower abdomen  Lab orders placed from triage:  MAU Labor Eval

## 2021-08-14 NOTE — Anesthesia Procedure Notes (Signed)
Epidural Patient location during procedure: OB Start time: 08/14/2021 7:45 PM End time: 08/14/2021 7:50 PM  Staffing Anesthesiologist: Lyn Hollingshead, MD Performed: anesthesiologist   Preanesthetic Checklist Completed: patient identified, IV checked, site marked, risks and benefits discussed, surgical consent, monitors and equipment checked, pre-op evaluation and timeout performed  Epidural Patient position: sitting Prep: DuraPrep and site prepped and draped Patient monitoring: continuous pulse ox and blood pressure Approach: midline Location: L3-L4 Injection technique: LOR air  Needle:  Needle type: Tuohy  Needle gauge: 17 G Needle length: 9 cm and 9 Needle insertion depth: 5 cm Catheter type: closed end flexible Catheter size: 19 Gauge Catheter at skin depth: 10 cm Test dose: negative and Other  Assessment Events: blood not aspirated, injection not painful, no injection resistance, no paresthesia and negative IV test  Additional Notes Reason for block:procedure for pain

## 2021-08-14 NOTE — Progress Notes (Signed)
MD LABOR PROGRESS NOTE  Morgan Sanchez is a 31 y.o. G1P0 at [redacted]w[redacted]d admitted for PROM  Subjective:  Patient comfortable now with epidural  Objective:  BP 102/72   Pulse (!) 101   Temp 98 F (36.7 C) (Oral)   Resp 16   Ht '5\' 1"'$  (1.549 m)   Wt 79.4 kg   LMP 11/02/2020 (Exact Date)   SpO2 100%   BMI 33.08 kg/m   No intake/output data recorded.  FHT:  FHR: 140 bpm, variability: moderate,  accelerations:  Present,  decelerations:  Absent UC:   irregular, every 2-565mutes SVE:   Dilation: 5 Effacement (%): 80 Station: 0 Exam by:: Morgan Sanchez Membranes - PROM clear  Pitocin @ 2 mu/min  Labs: Lab Results  Component Value Date   WBC 6.7 08/14/2021   HGB 12.8 08/14/2021   HCT 37.5 08/14/2021   MCV 80.1 08/14/2021   PLT 156 08/14/2021    Assessment / Plan: [age].o. G1P0 4044w1dn early/active labor Augmenting labor with pitocin, however patient has been 5 cm since 1800, still on initial dose of Pitocin, will titrate up 2x2  Labor:  progressed to 5 cm, currently stalled Fetal Wellbeing:  Category I Pain Control:  Epidural Anticipated MOD:  NSVD    Morgan Sanchez  08/14/2021 9:33 PM

## 2021-08-14 NOTE — MAU Provider Note (Signed)
[id number] [place] 29-Sep-1990  Patient informed that the ultrasound is considered a limited OB ultrasound and is not intended to be a complete ultrasound exam.  Patient also informed that the ultrasound is not being completed with the intent of assessing for fetal or placental anomalies or any pelvic abnormalities.  Explained that the purpose of today's ultrasound is to assess for  presentation.  Patient acknowledges the purpose of the exam and the limitations of the study.  Cephalic presentation noted.   Maryann Conners, CNM 08/14/2021, 11:41 AM

## 2021-08-14 NOTE — H&P (Signed)
OB ADMISSION HISTORY & PHYSICAL (late entry)  Admission Date: 08/14/2021  9:37 AM  Admit Diagnosis: PROM  Morgan Sanchez is a 31 y.o. female G76P0 74w1dadmitted from MAU for rupture of membranes. Patient reported leaking of clear fluid started at 07:30 this morning. She endorses active FM, denies any vaginal bleeding.  She has been having mild irregular cramping.   History of current pregnancy: G1P0   Prenatal Care with: [facility] (transfer of care at 20 weeks from Dr. CGarwin Brothers Patient entered prenatal care at 6 wks.   EDC 08/13/21 by 6 week 5 day [place] with LMP of 11/02/20  Anatomy scan:  21 wks, complete w/ posterior placenta.    Significant prenatal problems: 1.Sialorrhea 2.Hemoglobin C trait - FOB negative for SS gene 3.Uterine fibroids affecting pregnancy 4. Irritable bowel syndrome 5. Mixed Anxiety / Depression - no meds +counseling  Prenatal Labs: ABO, Rh: --/--/O POS (05/19 1001) Antibody: NEG (05/19 1001) Rubella: Immune (10/24 0000)  RPR: NON REACTIVE (05/19 1001)  HBsAg: Negative (10/24 0000)  HIV: Non-reactive (10/24 0000)  1 HR GCT: 159 (3HR GTT: PASS) GBS: Negative/-- (04/25 0000)  GC/CHL: Negative  Genetics: Declined Vaccines: Tdap: Y Flu Y Covid: Y  Prenatal Transfer Tool  Maternal Diabetes: No Genetic Screening: Declined Maternal Ultrasounds/Referrals: Other:Uterine fibroids Fetal Ultrasounds or other Referrals:  None Maternal Substance Abuse:  No Significant Maternal Medications:  Meds include: Protonix Other: phenergan, glycopyrrolate Significant Maternal Lab Results:  Group B Strep negative Other Comments:  None  OB History  Gravida Para Term Preterm AB Living  1         0  SAB IAB Ectopic Multiple Live Births          0    # Outcome Date GA Lbr Len/2nd Weight Sex Delivery Anes PTL Lv  1 Current             Medical / Surgical History: Past medical history:  Past Medical History:  Diagnosis Date   Anxiety    Depression    GERD  (gastroesophageal reflux disease)     Past surgical history:  Past Surgical History:  Procedure Laterality Date   KNEE SURGERY     WISDOM TOOTH EXTRACTION     Family History:  Family History  Problem Relation Age of Onset   Healthy Mother    Healthy Father     Social History:  reports that she has never smoked. She has never used smokeless tobacco. She reports that she does not currently use alcohol. She reports that she does not use drugs.  Allergies: Cherry and Latex   Current Medications at time of admission:  Prior to Admission medications   Medication Sig Start Date End Date Taking? Authorizing Provider  Iron-Vitamin C (IRON 100/C) 100-250 MG TABS Take by mouth.   Yes [provider]  Prenatal Vit-Fe Fumarate-FA (MULTIVITAMIN-PRENATAL) 27-0.8 MG TABS tablet Take 1 tablet by mouth daily at 12 noon.   Yes [provider]  ondansetron (ZOFRAN) 4 MG tablet Take 1 tablet (4 mg total) by mouth every 8 (eight) hours as needed for nausea or vomiting. 12/23/20   CServando Salina MD    Review of Systems: Constitutional: Negative   HENT: Negative   Eyes: Negative   Respiratory: Negative   Cardiovascular: Negative   Gastrointestinal: Negative  Genitourinary: neg for bloody show, pos for LOF   Musculoskeletal: Negative   Skin: Negative   Neurological: Negative   Endo/Heme/Allergies: Negative   Psychiatric/Behavioral: Negative  Physical Exam: VS: Blood pressure 102/72, pulse (!) 101, temperature 98 F (36.7 C), temperature source Oral, resp. rate 16, height '5\' 1"'$  (1.549 m), weight 79.4 kg, last menstrual period 11/02/2020, SpO2 100 %. AAO x3, no signs of distress Cardiovascular: RRR Respiratory: Lung fields clear to ausculation GU/GI: Abdomen gravid, non-tender, non-distended, active FM, vertex, EFW 7# by Leopold's Extremities: Neg edema, negative for pain, tenderness, and cords  Cervical exam (done by RN at time of admission):  Dilation:  1-2 Effacement (%): 50 Station: -1 Clear fluid + Fern FHR: baseline rate 140 / variability moderate / accelerations present / absent decelerations TOCO: occasional ctx  Most recent growth ultrasound 08/04/21: Lagging BPD and HC <7% Cephalic Posterior placenta normal AFI 19.9 EFW 3381 grams 7# 7oz 53%       Assessment: 31 y.o. G1P0 51w1dadmitted for PROM  First stage of labor FHR category 1 GBS neg Pain management plan: Labor support w/o meds   Plan:  Admit to Labor and Delivery Routine admission orders Epidural on maternal demand IV hydration Continuous monitoring Misoprostol for cervical ripening followed by pitocin for augmentation Anticipate nsvd delivery   WSanjuana KavaMD 08/14/2021 9:13 PM

## 2021-08-14 NOTE — Progress Notes (Signed)
MD LABOR PROGRESS NOTE  Subjective:   Patient comfortable, no complaints  Objective:    VS: BP 135/90   Pulse 98   Temp 97.7 F (36.5 C) (Oral)   Resp 14   Ht '5\' 1"'$  (1.549 m)   Wt 79.4 kg   LMP 11/02/2020 (Exact Date)   SpO2 100%   BMI 33.08 kg/m  FHR : baseline 130 / variability moderate / accelerations present with scalp stimulation 10x10 / several early variable decelerations Toco: contractions every 2-3 minutes   Membranes: Forebag ruptured - light Mec Dilation: 4.5 Effacement (%): 70 Cervical Position: Anterior Station: -2 Presentation: Vertex Exam by:: Gavin Potters, CNM Pitocin 4 mU/min  Assessment/Plan:   31 y.o. G1P0 76w1dadmtted for PROM. Still in early / active labor Forebag ruptured, IUPC placed.  Will continue to augment labor via pitocin until adequate MVUs.  Fetal well being: Category I as there was an acceleration with scalp stimulation and moderate variability is present - reassuring.  Anticipate NSVD    WSanjuana KavaMD 08/14/2021 11:07 PM

## 2021-08-14 NOTE — MAU Note (Signed)
Vertex presentation confirmed by bedside U/S.

## 2021-08-14 NOTE — Anesthesia Preprocedure Evaluation (Signed)
Anesthesia Evaluation  Patient identified by MRN, date of birth, ID band Patient awake    Reviewed: Allergy & Precautions, H&P , NPO status , Patient's Chart, lab work & pertinent test results  Airway Mallampati: II       Dental no notable dental hx.    Pulmonary neg pulmonary ROS,    Pulmonary exam normal        Cardiovascular hypertension, Normal cardiovascular exam     Neuro/Psych negative neurological ROS     GI/Hepatic Neg liver ROS,   Endo/Other  negative endocrine ROS  Renal/GU negative Renal ROS     Musculoskeletal   Abdominal (+) + obese,   Peds  Hematology negative hematology ROS (+)   Anesthesia Other Findings   Reproductive/Obstetrics (+) Pregnancy                             Anesthesia Physical Anesthesia Plan  ASA: 2  Anesthesia Plan: Epidural   Post-op Pain Management:    Induction:   PONV Risk Score and Plan:   Airway Management Planned:   Additional Equipment:   Intra-op Plan:   Post-operative Plan:   Informed Consent: I have reviewed the patients History and Physical, chart, labs and discussed the procedure including the risks, benefits and alternatives for the proposed anesthesia with the patient or authorized representative who has indicated his/her understanding and acceptance.       Plan Discussed with:   Anesthesia Plan Comments:         Anesthesia Quick Evaluation

## 2021-08-15 ENCOUNTER — Encounter (HOSPITAL_COMMUNITY): Payer: Self-pay | Admitting: Obstetrics & Gynecology

## 2021-08-15 LAB — DIC (DISSEMINATED INTRAVASCULAR COAGULATION)PANEL
D-Dimer, Quant: 3.89 ug/mL-FEU — ABNORMAL HIGH (ref 0.00–0.50)
Fibrinogen: 540 mg/dL — ABNORMAL HIGH (ref 210–475)
INR: 1 (ref 0.8–1.2)
Platelets: 133 10*3/uL — ABNORMAL LOW (ref 150–400)
Prothrombin Time: 12.9 seconds (ref 11.4–15.2)
Smear Review: NONE SEEN
aPTT: 29 seconds (ref 24–36)

## 2021-08-15 LAB — COMPREHENSIVE METABOLIC PANEL
ALT: 17 U/L (ref 0–44)
AST: 47 U/L — ABNORMAL HIGH (ref 15–41)
Albumin: 2.6 g/dL — ABNORMAL LOW (ref 3.5–5.0)
Alkaline Phosphatase: 202 U/L — ABNORMAL HIGH (ref 38–126)
Anion gap: 11 (ref 5–15)
BUN: 6 mg/dL (ref 6–20)
CO2: 17 mmol/L — ABNORMAL LOW (ref 22–32)
Calcium: 8.8 mg/dL — ABNORMAL LOW (ref 8.9–10.3)
Chloride: 107 mmol/L (ref 98–111)
Creatinine, Ser: 0.95 mg/dL (ref 0.44–1.00)
GFR, Estimated: >60 mL/min (ref >60)
Glucose, Bld: 86 mg/dL (ref 70–99)
Potassium: 4.1 mmol/L (ref 3.5–5.1)
Sodium: 135 mmol/L (ref 135–145)
Total Bilirubin: 0.8 mg/dL (ref 0.3–1.2)
Total Protein: 5.7 g/dL — ABNORMAL LOW (ref 6.5–8.1)

## 2021-08-15 LAB — CBC
HCT: 35.7 % — ABNORMAL LOW (ref 36.0–46.0)
Hemoglobin: 12.2 g/dL (ref 12.0–15.0)
MCH: 27.6 pg (ref 26.0–34.0)
MCHC: 34.2 g/dL (ref 30.0–36.0)
MCV: 80.8 fL (ref 80.0–100.0)
Platelets: 135 10*3/uL — ABNORMAL LOW (ref 150–400)
RBC: 4.42 MIL/uL (ref 3.87–5.11)
RDW: 17.8 % — ABNORMAL HIGH (ref 11.5–15.5)
WBC: 14.8 10*3/uL — ABNORMAL HIGH (ref 4.0–10.5)
nRBC: 0 % (ref 0.0–0.2)

## 2021-08-15 LAB — ABO/RH: ABO/RH(D): O POS

## 2021-08-15 LAB — POSTPARTUM HEMORRHAGE PROTOCOL (BB NOTIFICATION)

## 2021-08-15 MED ORDER — COCONUT OIL OIL: 1.0000 "application " | TOPICAL_OIL | Status: DC | PRN

## 2021-08-15 MED ORDER — LACTATED RINGERS IV SOLN: INTRAVENOUS | Status: DC

## 2021-08-15 MED ORDER — SENNOSIDES-DOCUSATE SODIUM 8.6-50 MG PO TABS
2.0000 | ORAL_TABLET | Freq: Every day | ORAL | Status: DC
  Administered 2021-08-16 – 2021-08-19 (×3): 2 via ORAL
  Filled 2021-08-15 (×4): qty 2

## 2021-08-15 MED ORDER — DIBUCAINE (PERIANAL) 1 % EX OINT
1.0000 "application " | TOPICAL_OINTMENT | CUTANEOUS | Status: DC | PRN
  Administered 2021-08-15: 1 via RECTAL
  Filled 2021-08-15: qty 28

## 2021-08-15 MED ORDER — LACTATED RINGERS IV BOLUS: 1000.0000 mL | Freq: Once | INTRAVENOUS | Status: DC

## 2021-08-15 MED ORDER — HYDRALAZINE HCL 20 MG/ML IJ SOLN: 10.0000 mg | INTRAMUSCULAR | Status: DC | PRN

## 2021-08-15 MED ORDER — LABETALOL HCL 5 MG/ML IV SOLN: 20.0000 mg | INTRAVENOUS | Status: DC | PRN

## 2021-08-15 MED ORDER — TRANEXAMIC ACID-NACL 1000-0.7 MG/100ML-% IV SOLN
INTRAVENOUS | Status: AC
Start: 2021-08-15 — End: 2021-08-15
  Administered 2021-08-15: 1000 mg via INTRAVENOUS
  Filled 2021-08-15: qty 100

## 2021-08-15 MED ORDER — LABETALOL HCL 5 MG/ML IV SOLN: 40.0000 mg | INTRAVENOUS | Status: DC | PRN

## 2021-08-15 MED ORDER — METOCLOPRAMIDE HCL 5 MG/ML IJ SOLN: 10.0000 mg | Freq: Once | INTRAMUSCULAR | Status: DC

## 2021-08-15 MED ORDER — PRENATAL MULTIVITAMIN CH
1.0000 | ORAL_TABLET | Freq: Every day | ORAL | Status: DC
  Administered 2021-08-15 – 2021-08-18 (×4): 1 via ORAL
  Filled 2021-08-15 (×4): qty 1

## 2021-08-15 MED ORDER — ESTROGENS CONJUGATED 0.625 MG/GM VA CREA: 1.0000 | TOPICAL_CREAM | Freq: Every day | VAGINAL | Status: DC

## 2021-08-15 MED ORDER — PROMETHAZINE HCL 25 MG/ML IJ SOLN
12.5000 mg | Freq: Once | INTRAMUSCULAR | Status: AC
  Administered 2021-08-15: 12.5 mg via INTRAVENOUS
  Filled 2021-08-15: qty 0.5

## 2021-08-15 MED ORDER — OXYCODONE-ACETAMINOPHEN 5-325 MG PO TABS
1.0000 | ORAL_TABLET | ORAL | Status: DC | PRN
  Administered 2021-08-15 – 2021-08-18 (×3): 1 via ORAL
  Filled 2021-08-15 (×3): qty 1

## 2021-08-15 MED ORDER — MISOPROSTOL 200 MCG PO TABS
1000.0000 ug | ORAL_TABLET | Freq: Once | ORAL | Status: AC
  Administered 2021-08-15: 1000 ug via RECTAL

## 2021-08-15 MED ORDER — LABETALOL HCL 5 MG/ML IV SOLN: 80.0000 mg | INTRAVENOUS | Status: DC | PRN

## 2021-08-15 MED ORDER — METOCLOPRAMIDE HCL 5 MG/ML IJ SOLN
INTRAMUSCULAR | Status: AC
  Filled 2021-08-15: qty 2

## 2021-08-15 MED ORDER — SIMETHICONE 80 MG PO CHEW
80.0000 mg | CHEWABLE_TABLET | Freq: Four times a day (QID) | ORAL | Status: DC
  Administered 2021-08-15 – 2021-08-18 (×9): 80 mg via ORAL
  Filled 2021-08-15 (×11): qty 1

## 2021-08-15 MED ORDER — SODIUM CHLORIDE 0.9 % IV SOLN
3.0000 g | Freq: Once | INTRAVENOUS | Status: AC
  Administered 2021-08-15: 3 g via INTRAVENOUS
  Filled 2021-08-15: qty 8

## 2021-08-15 MED ORDER — TRANEXAMIC ACID-NACL 1000-0.7 MG/100ML-% IV SOLN: 1000.0000 mg | INTRAVENOUS | Status: AC

## 2021-08-15 MED ORDER — OXYCODONE-ACETAMINOPHEN 5-325 MG PO TABS: 2.0000 | ORAL_TABLET | ORAL | Status: DC | PRN

## 2021-08-15 MED ORDER — FAMOTIDINE IN NACL 20-0.9 MG/50ML-% IV SOLN
20.0000 mg | Freq: Once | INTRAVENOUS | Status: AC
  Administered 2021-08-15: 20 mg via INTRAVENOUS
  Filled 2021-08-15: qty 50

## 2021-08-15 MED ORDER — MISOPROSTOL 200 MCG PO TABS
ORAL_TABLET | ORAL | Status: AC
  Filled 2021-08-15: qty 5

## 2021-08-15 MED ORDER — WITCH HAZEL-GLYCERIN EX PADS
1.0000 "application " | MEDICATED_PAD | CUTANEOUS | Status: DC | PRN
  Administered 2021-08-15: 1 via TOPICAL

## 2021-08-15 MED ORDER — ESTROGENS CONJUGATED 0.625 MG/GM VA CREA
1.0000 | TOPICAL_CREAM | Freq: Every day | VAGINAL | Status: DC
  Filled 2021-08-15: qty 30

## 2021-08-15 MED ORDER — TRANEXAMIC ACID-NACL 1000-0.7 MG/100ML-% IV SOLN
INTRAVENOUS | Status: AC
Start: 2021-08-15 — End: 2021-08-15
  Administered 2021-08-15: 1000 mg
  Filled 2021-08-15: qty 100

## 2021-08-15 MED ORDER — IBUPROFEN 600 MG PO TABS
600.0000 mg | ORAL_TABLET | Freq: Four times a day (QID) | ORAL | Status: DC
  Administered 2021-08-15 – 2021-08-19 (×16): 600 mg via ORAL
  Filled 2021-08-15 (×16): qty 1

## 2021-08-15 MED ORDER — DIPHENHYDRAMINE HCL 25 MG PO CAPS
25.0000 mg | ORAL_CAPSULE | Freq: Four times a day (QID) | ORAL | Status: DC | PRN
Start: 2021-08-15 — End: 2021-08-19

## 2021-08-15 MED ORDER — TRANEXAMIC ACID-NACL 1000-0.7 MG/100ML-% IV SOLN
1000.0000 mg | INTRAVENOUS | Status: DC
Start: 2021-08-15 — End: 2021-08-15

## 2021-08-15 MED ORDER — METOCLOPRAMIDE HCL 5 MG/ML IJ SOLN: 10.0000 mg | Freq: Four times a day (QID) | INTRAMUSCULAR | Status: DC | PRN

## 2021-08-15 MED ORDER — ACETAMINOPHEN 325 MG PO TABS
650.0000 mg | ORAL_TABLET | ORAL | Status: DC | PRN
  Administered 2021-08-18: 650 mg via ORAL
  Filled 2021-08-15: qty 2

## 2021-08-15 MED ORDER — BENZOCAINE-MENTHOL 20-0.5 % EX AERO
1.0000 "application " | INHALATION_SPRAY | CUTANEOUS | Status: DC | PRN
  Administered 2021-08-17: 1 via TOPICAL
  Filled 2021-08-15: qty 56

## 2021-08-15 MED ORDER — ZOLPIDEM TARTRATE 5 MG PO TABS: 5.0000 mg | ORAL_TABLET | Freq: Every evening | ORAL | Status: DC | PRN

## 2021-08-15 MED ORDER — OXYTOCIN-SODIUM CHLORIDE 30-0.9 UT/500ML-% IV SOLN: 2.5000 [IU]/h | INTRAVENOUS | Status: DC | PRN

## 2021-08-15 NOTE — Lactation Note (Signed)
This note was copied from a baby's chart. Lactation Consultation Note  Patient Name: Morgan Sanchez [id number] Date: 08/15/2021 Reason for consult: Initial assessment;Primapara;1st time breastfeeding;Term;Breastfeeding assistance Age:32 hours  LC entered the room and dad was holding baby. Per mom she has tried to latch baby, but she began to get frustrated so she gave her formula. Mom did notice breast changes during pregnancy and states that baby Morgan Sanchez stayed on the left breast longer than the right. LC gave mom the supplementation guidelines and encouraged mom to latch baby first prior to feeding formula.   LC encouraged mom to continue to try to latch with feeding cues and use hand expression for stimulation.   Mom will call for assistance with latch.   Current feeding plan:  Watch for baby's cues and latch prior to feeding formula.  Feed baby according to feeding guidelines.  Hand express for extra stimulation and feed any milk that she gets back to baby.   Maternal Data Has patient been taught Hand Expression?: Yes Does the patient have breastfeeding experience prior to this delivery?: No  Feeding Mother's Current Feeding Choice: Breast Milk and Formula Nipple Type: Slow - flow  LATCH Score                    Lactation Tools Discussed/Used    Interventions Interventions: Breast feeding basics reviewed;Hand express;Education  Discharge    Consult Status Consult Status: Follow-up Date: 08/16/21 Follow-up type: In-patient    Morgan Sanchez 08/15/2021, 8:29 PM

## 2021-08-16 LAB — COMPREHENSIVE METABOLIC PANEL
ALT: 16 U/L (ref 0–44)
AST: 57 U/L — ABNORMAL HIGH (ref 15–41)
Albumin: 2 g/dL — ABNORMAL LOW (ref 3.5–5.0)
Alkaline Phosphatase: 148 U/L — ABNORMAL HIGH (ref 38–126)
Anion gap: 3 — ABNORMAL LOW (ref 5–15)
BUN: <5 mg/dL — ABNORMAL LOW (ref 6–20)
CO2: 23 mmol/L (ref 22–32)
Calcium: 8.4 mg/dL — ABNORMAL LOW (ref 8.9–10.3)
Chloride: 112 mmol/L — ABNORMAL HIGH (ref 98–111)
Creatinine, Ser: 0.76 mg/dL (ref 0.44–1.00)
GFR, Estimated: >60 mL/min (ref >60)
Glucose, Bld: 74 mg/dL (ref 70–99)
Potassium: 4.2 mmol/L (ref 3.5–5.1)
Sodium: 138 mmol/L (ref 135–145)
Total Bilirubin: 0.1 mg/dL — ABNORMAL LOW (ref 0.3–1.2)
Total Protein: 4.6 g/dL — ABNORMAL LOW (ref 6.5–8.1)

## 2021-08-16 LAB — CBC
HCT: 22 % — ABNORMAL LOW (ref 36.0–46.0)
Hemoglobin: 8 g/dL — ABNORMAL LOW (ref 12.0–15.0)
MCH: 28.8 pg (ref 26.0–34.0)
MCHC: 36.4 g/dL — ABNORMAL HIGH (ref 30.0–36.0)
MCV: 79.1 fL — ABNORMAL LOW (ref 80.0–100.0)
Platelets: 104 10*3/uL — ABNORMAL LOW (ref 150–400)
RBC: 2.78 MIL/uL — ABNORMAL LOW (ref 3.87–5.11)
RDW: 17.2 % — ABNORMAL HIGH (ref 11.5–15.5)
WBC: 10.5 10*3/uL (ref 4.0–10.5)
nRBC: 0 % (ref 0.0–0.2)

## 2021-08-16 MED ORDER — MAGNESIUM SULFATE BOLUS VIA INFUSION
4.0000 g | Freq: Once | INTRAVENOUS | Status: AC
  Administered 2021-08-16: 4 g via INTRAVENOUS
  Filled 2021-08-16: qty 1000

## 2021-08-16 MED ORDER — LACTATED RINGERS IV SOLN: INTRAVENOUS | Status: DC

## 2021-08-16 MED ORDER — MAGNESIUM SULFATE 40 GM/1000ML IV SOLN
2.0000 g/h | INTRAVENOUS | Status: DC
  Administered 2021-08-17: 2 g/h via INTRAVENOUS
  Filled 2021-08-16 (×2): qty 1000

## 2021-08-16 NOTE — Anesthesia Postprocedure Evaluation (Signed)
Anesthesia Post Note  Patient: Engineer, petroleum  Procedure(s) Performed: AN AD HOC LABOR EPIDURAL     Patient location during evaluation: Mother Baby Anesthesia Type: Epidural Level of consciousness: awake and alert Pain management: pain level controlled Vital Signs Assessment: post-procedure vital signs reviewed and stable Respiratory status: spontaneous breathing, nonlabored ventilation and respiratory function stable Cardiovascular status: stable Postop Assessment: no headache, no backache, epidural receding, no apparent nausea or vomiting, patient able to bend at knees, adequate PO intake and able to ambulate Anesthetic complications: no   No notable events documented.  Last Vitals:  Vitals:   08/16/21 0803 08/16/21 0813  BP: (!) 144/87 (!) 147/74  Pulse: (!) 110 (!) 105  Resp: 17 17  Temp:    SpO2:      Last Pain:  Vitals:   08/16/21 0753  TempSrc: Oral  PainSc: 0-No pain   Pain Goal:                Epidural/Spinal Function Cutaneous sensation: Normal sensation (08/16/21 0753), Patient able to flex knees: Yes (08/16/21 0753), Patient able to lift hips off bed: Yes (08/16/21 0753), Back pain beyond tenderness at insertion site: No (08/16/21 0753), Progressively worsening motor and/or sensory loss: No (08/16/21 0753), Bowel and/or bladder incontinence post epidural: No (08/16/21 0753)  Jabier Mutton

## 2021-08-16 NOTE — Progress Notes (Signed)
MOB was referred for history of depression/anxiety. * Referral screened out by Clinical Social Worker because none of the following criteria appear to apply: ~ History of anxiety/depression during this pregnancy, or of post-partum depression. ~ Diagnosis of anxiety and/or depression within last 3 years OR * MOB's symptoms currently being treated with medication and/or therapy. Per MOB's OB records MOB has a therapist that she meets with regularly.   MOB's Edinburgh Score is 2.  Please contact the Clinical Social Worker if needs arise, or if MOB requests.   Laurey Arrow, MSW, LCSW Clinical Social Work [phone]

## 2021-08-16 NOTE — Lactation Note (Signed)
This note was copied from a baby's chart. Lactation Consultation Note  Patient Name: Morgan Sanchez [id number] Date: 08/16/2021 Reason for consult: Follow-up assessment;Term;Primapara;1st time breastfeeding Age:32 hours  P1 mother whose infant is now 65 hours old.  This is a term baby at 40+2 weeks.  Mother's current feeding preference is breast/formula.  Mother is on magnesium sulfate.  She has been primarily formula feeding.  Offered to assist with latch when I observed baby showing cues.  Mother seemed reluctant but agreed.  Taught hand expression, however, no colostrum drops noted.  Assisted to latch easily but "[name]" became very fussy and frantic after taking 2-3 sucks.  Few attempts made but she continued to be fussy at the breast and pushed back.  Reassurance given.  Mother desired to have father formula feed.  Mother has not been pumping even though RN set up the pump.  Discussed the importance of continuing to practice breast massage and hand expression before/after pumping.  Suggested mother pump every three hours for breast stimulation and continue latching with every feed before giving formula.  Mother verbalized understanding.  Offered to review and/or observe, but mother declined.  Father and grandmother present and assisting with care.  RN updated.    Maternal Data Has patient been taught Hand Expression?: Yes Does the patient have breastfeeding experience prior to this delivery?: No  Feeding Mother's Current Feeding Choice: Breast Milk and Formula Nipple Type: Nfant Standard Flow (white)  LATCH Score Latch: Repeated attempts needed to sustain latch, nipple held in mouth throughout feeding, stimulation needed to elicit sucking reflex.  Audible Swallowing: None  Type of Nipple: Everted at rest and after stimulation  Comfort (Breast/Nipple): Soft / non-tender  Hold (Positioning): Assistance needed to correctly position infant at breast and maintain  latch.  LATCH Score: 6   Lactation Tools Discussed/Used Tools: Pump;Flanges Flange Size: 24 Breast pump type: Double-Electric Breast Pump;Manual Pump Education:  (Mother reported the RN showed her pump set up; no review needed) Reason for Pumping: Breast stimulation for supplementation Pumping frequency: Every three hours  Interventions Interventions: Breast feeding basics reviewed;Assisted with latch;Skin to skin;Breast massage;Hand express;Breast compression;Position options;Support pillows;Adjust position;DEBP;Education  Discharge    Consult Status Consult Status: Follow-up Date: 08/17/21 Follow-up type: In-patient    [name] R [name] 08/16/2021, 2:22 PM

## 2021-08-16 NOTE — Plan of Care (Signed)
  Problem: Education: Goal: Knowledge of condition will improve Outcome: Progressing Goal: Individualized Educational Video(s) Outcome: Progressing Goal: Individualized Newborn Educational Video(s) Outcome: Progressing   Problem: Activity: Goal: Will verbalize the importance of balancing activity with adequate rest periods Outcome: Progressing Goal: Ability to tolerate increased activity will improve Outcome: Progressing   Problem: Coping: Goal: Ability to identify and utilize available resources and services will improve Outcome: Progressing   Problem: Life Cycle: Goal: Chance of risk for complications during the postpartum period will decrease Outcome: Progressing   Problem: Role Relationship: Goal: Ability to demonstrate positive interaction with newborn will improve Outcome: Progressing   Problem: Skin Integrity: Goal: Demonstration of wound healing without infection will improve Outcome: Progressing   Problem: Education: Goal: Knowledge of disease or condition will improve Outcome: Progressing Goal: Knowledge of the prescribed therapeutic regimen will improve Outcome: Progressing   Problem: Fluid Volume: Goal: Peripheral tissue perfusion will improve Outcome: Progressing   Problem: Clinical Measurements: Goal: Complications related to disease process, condition or treatment will be avoided or minimized Outcome: Progressing

## 2021-08-16 NOTE — Progress Notes (Signed)
MD PROGRESS NOTE  Morgan Sanchez [id number]  PPD# 1 s/p SVD & PPH Subjective:   Patient without complaints. She denies headache blurry vision, or epigastric pain She does feel "very fatigued" She is tolerating PO fluid and solids w/o nausea or vomiting Bleeding is light Up ad lib / ambulatory / voiding w/o difficulty Feeding: Bottle and Breast. Mother and baby are bonding well.    Objective:     Vitals:   08/15/21 1010 08/15/21 1100 08/15/21 1223 08/15/21 1555  BP: (!) 149/89 (!) 149/103 (!) 156/77 (!) 144/83   08/15/21 1945 08/15/21 2329 08/16/21 0441 08/16/21 0753  BP: 140/65 (!) 146/82 140/82 133/68   08/16/21 0803 08/16/21 0813 08/16/21 0900 08/16/21 1000  BP: (!) 144/87 (!) 147/74 131/89 130/75     Physical Exam: Alert and oriented X3 Abdomen: soft, non-tender, non-distended  Fundus: firm, non-tender, U-1 Perineum: intact Lochia: minimal Extremities: No edema, no calf pain, tenderness, or cords Neuro: no clonus, normal DTRs  Labs:  CBC     Status: Abnormal   Collection Time: 08/14/21 10:01 AM  Result Value Ref Range   WBC 6.5 4.0 - 10.5 K/uL    Comment: WHITE COUNT CONFIRMED ON SMEAR   RBC 4.63 3.87 - 5.11 MIL/uL   Hemoglobin 13.0 12.0 - 15.0 g/dL   HCT 36.6 36.0 - 46.0 %   MCV 79.0 (L) 80.0 - 100.0 fL   MCH 28.1 26.0 - 34.0 pg   MCHC 35.5 30.0 - 36.0 g/dL   RDW 17.7 (H) 11.5 - 15.5 %   Platelets 148 (L) 150 - 400 K/uL    Comment: REPEATED TO VERIFY   nRBC 0.0 0.0 - 0.2 %    Comment: Performed at [facility], [place] [address], [place], [place] [address]  Comprehensive metabolic panel     Status: Abnormal   Collection Time: 08/14/21 10:01 AM  Result Value Ref Range   Sodium 137 135 - 145 mmol/L   Potassium 3.6 3.5 - 5.1 mmol/L   Chloride 108 98 - 111 mmol/L   CO2 19 (L) 22 - 32 mmol/L   Glucose, Bld 78 70 - 99 mg/dL    Comment: Glucose reference range applies only to samples taken after fasting for at least 8 hours.   BUN 5 (L) 6 - 20 mg/dL    Creatinine, Ser 0.66 0.44 - 1.00 mg/dL   Calcium 9.4 8.9 - 10.3 mg/dL   Total Protein 6.8 6.5 - 8.1 g/dL   Albumin 3.0 (L) 3.5 - 5.0 g/dL   AST 28 15 - 41 U/L   ALT 15 0 - 44 U/L   Alkaline Phosphatase 245 (H) 38 - 126 U/L   Total Bilirubin 0.6 0.3 - 1.2 mg/dL   GFR, Estimated >60 >60 mL/min    Comment: (NOTE) Calculated using the CKD-EPI Creatinine Equation (2021)   Protein / creatinine ratio, urine     Status: None   Collection Time: 08/14/21  8:37 PM  Result Value Ref Range   Creatinine, Urine 68.66 mg/dL   Total Protein, Urine 9 mg/dL    Comment: NO NORMAL RANGE ESTABLISHED FOR THIS TEST   Protein Creatinine Ratio 0.13 0.00 - 0.15 mg/mg[Cre]    Comment: Performed at [facility], [place] [address] [place], [place] [address]  Initiate postpartum hemorrhage protocol - BB notification     Status: None   Collection Time: 08/15/21  6:57 AM  Result Value Ref Range   Initiate PPH protocol (BB notified)  [place] ORDER RECEIVED Performed at [facility], [place] [address], [place], [place] [address]   DIC Panel ONCE - STAT     Status: Abnormal   Collection Time: 08/15/21  7:26 AM  Result Value Ref Range   Prothrombin Time 12.9 11.4 - 15.2 seconds   INR 1.0 0.8 - 1.2    Comment: (NOTE) INR goal varies based on device and disease states.    aPTT 29 24 - 36 seconds   Fibrinogen 540 (H) 210 - 475 mg/dL    Comment: (NOTE) Fibrinogen results may be underestimated in patients receiving thrombolytic therapy.    D-Dimer, Quant 3.89 (H) 0.00 - 0.50 ug/mL-FEU    Comment: (NOTE) At the manufacturer cut-off value of 0.5 g/mL FEU, this assay has a negative predictive value of 95-100%.This assay is intended for use in conjunction with a clinical pretest probability (PTP) assessment model to exclude pulmonary embolism (PE) and deep venous thrombosis (DVT) in outpatients suspected of PE or DVT. Results should be correlated with clinical presentation.    Platelets 133 (L) 150 - 400  K/uL  CBC     Status: Abnormal   Collection Time: 08/15/21  7:26 AM  Result Value Ref Range   WBC 14.8 (H) 4.0 - 10.5 K/uL   RBC 4.42 3.87 - 5.11 MIL/uL   Hemoglobin 12.2 12.0 - 15.0 g/dL   HCT 35.7 (L) 36.0 - 46.0 %   MCV 80.8 80.0 - 100.0 fL   MCH 27.6 26.0 - 34.0 pg   MCHC 34.2 30.0 - 36.0 g/dL   RDW 17.8 (H) 11.5 - 15.5 %   Platelets 135 (L) 150 - 400 K/uL    Comment: REPEATED TO VERIFY   nRBC 0.0 0.0 - 0.2 %    Comment: Performed at [facility], [place] [address] [place],  [address]  Comprehensive metabolic panel     Status: Abnormal   Collection Time: 08/15/21  7:31 AM  Result Value Ref Range   Sodium 135 135 - 145 mmol/L   Potassium 4.1 3.5 - 5.1 mmol/L   Chloride 107 98 - 111 mmol/L   CO2 17 (L) 22 - 32 mmol/L   Glucose, Bld 86 70 - 99 mg/dL    Comment: Glucose reference range applies only to samples taken after fasting for at least 8 hours.   BUN 6 6 - 20 mg/dL   Creatinine, Ser 0.95 0.44 - 1.00 mg/dL   Calcium 8.8 (L) 8.9 - 10.3 mg/dL   Total Protein 5.7 (L) 6.5 - 8.1 g/dL   Albumin 2.6 (L) 3.5 - 5.0 g/dL   AST 47 (H) 15 - 41 U/L   ALT 17 0 - 44 U/L   Alkaline Phosphatase 202 (H) 38 - 126 U/L   Total Bilirubin 0.8 0.3 - 1.2 mg/dL   GFR, Estimated >60 >60 mL/min    Comment: (NOTE) Calculated using the CKD-EPI Creatinine Equation (2021)    Anion gap 11 5 - 15    Comment: Performed at [facility], [place] [address], [place], [place] [address]  CBC     Status: Abnormal   Collection Time: 08/16/21  4:29 AM  Result Value Ref Range   WBC 10.5 4.0 - 10.5 K/uL   RBC 2.78 (L) 3.87 - 5.11 MIL/uL   Hemoglobin 8.0 (L) 12.0 - 15.0 g/dL    Comment: REPEATED TO VERIFY   HCT 22.0 (L) 36.0 - 46.0 %   MCV 79.1 (L) 80.0 - 100.0 fL  MCH 28.8 26.0 - 34.0 pg   MCHC 36.4 (H) 30.0 - 36.0 g/dL   RDW 17.2 (H) 11.5 - 15.5 %   Platelets 104 (L) 150 - 400 K/uL    Comment: Immature Platelet Fraction may be clinically indicated, consider ordering this additional  test [id number] REPEATED TO VERIFY    nRBC 0.0 0.0 - 0.2 %    Comment: Performed at [facility], [place] [address], [place], [place] [address]  Comprehensive metabolic panel     Status: Abnormal   Collection Time: 08/16/21  4:29 AM  Result Value Ref Range   Sodium 138 135 - 145 mmol/L   Potassium 4.2 3.5 - 5.1 mmol/L   Chloride 112 (H) 98 - 111 mmol/L   CO2 23 22 - 32 mmol/L   Glucose, Bld 74 70 - 99 mg/dL    Comment: Glucose reference range applies only to samples taken after fasting for at least 8 hours.   BUN <5 (L) 6 - 20 mg/dL   Creatinine, Ser 0.76 0.44 - 1.00 mg/dL   Calcium 8.4 (L) 8.9 - 10.3 mg/dL   Total Protein 4.6 (L) 6.5 - 8.1 g/dL   Albumin 2.0 (L) 3.5 - 5.0 g/dL   AST 57 (H) 15 - 41 U/L   ALT 16 0 - 44 U/L   Alkaline Phosphatase 148 (H) 38 - 126 U/L   Total Bilirubin 0.1 (L) 0.3 - 1.2 mg/dL   GFR, Estimated >60 >60 mL/min    Comment: (NOTE) Calculated using the CKD-EPI Creatinine Equation (2021)    Anion gap 3 (L) 5 - 15    Comment: Performed at [facility], [place] 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  Patient denies clinical symptoms of toxemia and diuresis is good.   Plan:  Magnesium sulfate IV initiated for seizure prophylaxis Will add antihypertensive (Likely procardia after magnesium is discontinued) Will repeat Pre E labs in AM Possible D/C in am if patient is doing well and blood pressures have normalized  Sanjuana Kava, MD 08/16/2021, 11:06 AM

## 2021-08-17 DIAGNOSIS — O1414 Severe pre-eclampsia complicating childbirth: Principal | ICD-10-CM | POA: Diagnosis present

## 2021-08-17 LAB — COMPREHENSIVE METABOLIC PANEL
ALT: 24 U/L (ref 0–44)
AST: 65 U/L — ABNORMAL HIGH (ref 15–41)
Albumin: 2.5 g/dL — ABNORMAL LOW (ref 3.5–5.0)
Alkaline Phosphatase: 162 U/L — ABNORMAL HIGH (ref 38–126)
Anion gap: 6 (ref 5–15)
BUN: <5 mg/dL — ABNORMAL LOW (ref 6–20)
CO2: 26 mmol/L (ref 22–32)
Calcium: 7.2 mg/dL — ABNORMAL LOW (ref 8.9–10.3)
Chloride: 107 mmol/L (ref 98–111)
Creatinine, Ser: 0.67 mg/dL (ref 0.44–1.00)
GFR, Estimated: >60 mL/min (ref >60)
Glucose, Bld: 97 mg/dL (ref 70–99)
Potassium: 3.7 mmol/L (ref 3.5–5.1)
Sodium: 139 mmol/L (ref 135–145)
Total Bilirubin: 0.4 mg/dL (ref 0.3–1.2)
Total Protein: 5.9 g/dL — ABNORMAL LOW (ref 6.5–8.1)

## 2021-08-17 LAB — CBC
HCT: 26.3 % — ABNORMAL LOW (ref 36.0–46.0)
Hemoglobin: 9.6 g/dL — ABNORMAL LOW (ref 12.0–15.0)
MCH: 28.8 pg (ref 26.0–34.0)
MCHC: 36.5 g/dL — ABNORMAL HIGH (ref 30.0–36.0)
MCV: 79 fL — ABNORMAL LOW (ref 80.0–100.0)
Platelets: 146 10*3/uL — ABNORMAL LOW (ref 150–400)
RBC: 3.33 MIL/uL — ABNORMAL LOW (ref 3.87–5.11)
RDW: 17.5 % — ABNORMAL HIGH (ref 11.5–15.5)
WBC: 9 10*3/uL (ref 4.0–10.5)
nRBC: 0 % (ref 0.0–0.2)

## 2021-08-17 MED ORDER — METOCLOPRAMIDE HCL 5 MG/ML IJ SOLN: 10.0000 mg | Freq: Four times a day (QID) | INTRAMUSCULAR | Status: DC | PRN

## 2021-08-17 MED ORDER — MORPHINE SULFATE (PF) 2 MG/ML IV SOLN: 2.0000 mg | INTRAVENOUS | Status: DC | PRN

## 2021-08-17 MED ORDER — NIFEDIPINE ER OSMOTIC RELEASE 60 MG PO TB24
60.0000 mg | ORAL_TABLET | Freq: Every day | ORAL | Status: DC
  Administered 2021-08-18: 60 mg via ORAL
  Filled 2021-08-17: qty 1

## 2021-08-17 MED ORDER — NIFEDIPINE ER OSMOTIC RELEASE 30 MG PO TB24
30.0000 mg | ORAL_TABLET | Freq: Two times a day (BID) | ORAL | Status: DC
  Administered 2021-08-17 (×2): 30 mg via ORAL
  Filled 2021-08-17 (×2): qty 1

## 2021-08-17 NOTE — Lactation Note (Signed)
This note was copied from a baby's chart. Lactation Consultation Note  Patient Name: Morgan Sanchez Date: 08/17/2021   Age:32 hours  Mother is formula feeding and declined follow up from lactation.  [facility] RN confirmed and will review engorgement care.      Vivianne Master [facility] 08/17/2021, 9:49 AM

## 2021-08-17 NOTE — Progress Notes (Signed)
Called to room at 9:40 am with patient reporting that she had trouble breathing during her shower today.  She describes the sensation as the muscles in her abdomen pulling and causing discomfort with taking deep breaths.  Denies chest heaviness or chest pain.  VSS, heart and lung sounds normal on auscultation. Says that symptoms have decreased in the few minutes that she has been laying down on bed after shower.  Dr. Alesia Richards in for morning rounds while RN still in room and made aware of patient's symptoms.  Dr. Alesia Richards performed physical exam, saw most recent vital signs, verified patient's antihypertensive medications, and instructed patient to notify staff if the symptom recurred or worsened.  RN in room to administer medication at 12:50 pm and patient reported that symptoms had abated.

## 2021-08-17 NOTE — Progress Notes (Signed)
Post Partum Day 2 Subjective: Patient reports feeling tired.  She had some chest tightness that resolved by itself earlier on.   She denies current chest pain or shortness of breath or nausea or vomiting.  Objective: Blood pressure (!) 158/100, pulse 80, temperature 97.6 F (36.4 C), temperature source Oral, resp. rate 20, height '5\' 1"'$  (1.549 m), weight 79.4 kg, last menstrual period 11/02/2020, SpO2 99 %, unknown if currently breastfeeding.     08/17/2021    8:15 AM 08/17/2021    4:15 AM 08/16/2021   11:30 PM  Vitals with BMI  Systolic 182 993 716  Diastolic 967 88 86  Pulse 80 89 101    Physical Exam:  General: alert, cooperative, and no distress CVS: s1, s2, Regular rate and rhythm Lungs: Clear to auscultation bilaterally.  Lochia: appropriate Uterine Fundus: firm DVT Evaluation: No evidence of DVT seen on physical exam.  Recent Labs    08/16/21 0429 08/17/21 0510  HGB 8.0* 9.6*  HCT 22.0* 26.3*      Latest Ref Rng & Units 08/17/2021    5:10 AM 08/16/2021    4:29 AM 08/15/2021    7:26 AM  CBC  WBC 4.0 - 10.5 K/uL 9.0   10.5   14.8    Hemoglobin 12.0 - 15.0 g/dL 9.6   8.0   12.2    Hematocrit 36.0 - 46.0 % 26.3   22.0   35.7    Platelets 150 - 400 K/uL 146   104   133     135         Latest Ref Rng & Units 08/17/2021    5:10 AM 08/16/2021    4:29 AM 08/15/2021    7:31 AM  CMP  Glucose 70 - 99 mg/dL 97   74   86    BUN 6 - 20 mg/dL <5   <5   6    Creatinine 0.44 - 1.00 mg/dL 0.67   0.76   0.95    Sodium 135 - 145 mmol/L 139   138   135    Potassium 3.5 - 5.1 mmol/L 3.7   4.2   4.1    Chloride 98 - 111 mmol/L 107   112   107    CO2 22 - 32 mmol/L '26   23   17    '$ Calcium 8.9 - 10.3 mg/dL 7.2   8.4   8.8    Total Protein 6.5 - 8.1 g/dL 5.9   4.6   5.7    Total Bilirubin 0.3 - 1.2 mg/dL 0.4   0.1   0.8    Alkaline Phos 38 - 126 U/L 162   148   202    AST 15 - 41 U/L 65   57   47    ALT 0 - 44 U/L '24   16   17       '$ SpO2    99 100   100   100    SpO2    Delivery      --          Delivery    Intake   P.O.    120   290        P.O.    Intake (mL): P.O.    120   290        Intake (mL): P.O.    Total In    120   290  Total In    Output   Urine    1000   900   700     Urine    Output (mL): Urine    1000   900   700     Output (mL): Urine    Total Out    1000   900   700     Total Out    I/O Net    -880   -610   -700     I/O Net     Assessment/Plan: PPD # 2 after a vaginal delivery, complicated by postpartum hemorrhage, atypical HELLP, s/p 22 hours of postpartum magnesium sulfate and now on procardia 30 mg Q BID  - With continued elevated blood pressures. Plan for repeat procardia this evening and  increasing dosage tomorrow AM - AST continues to trend upwards, will recheck tomorrow AM.    LOS: 3 days   Archie Endo, MD.  08/17/2021, 8:36 AM

## 2021-08-18 ENCOUNTER — Other Ambulatory Visit (HOSPITAL_COMMUNITY): Payer: Self-pay

## 2021-08-18 LAB — CBC
HCT: 26.2 % — ABNORMAL LOW (ref 36.0–46.0)
Hemoglobin: 9.2 g/dL — ABNORMAL LOW (ref 12.0–15.0)
MCH: 28 pg (ref 26.0–34.0)
MCHC: 35.1 g/dL (ref 30.0–36.0)
MCV: 79.6 fL — ABNORMAL LOW (ref 80.0–100.0)
Platelets: 169 10*3/uL (ref 150–400)
RBC: 3.29 MIL/uL — ABNORMAL LOW (ref 3.87–5.11)
RDW: 17.5 % — ABNORMAL HIGH (ref 11.5–15.5)
WBC: 6.7 10*3/uL (ref 4.0–10.5)
nRBC: 0 % (ref 0.0–0.2)

## 2021-08-18 LAB — COMPREHENSIVE METABOLIC PANEL
ALT: 69 U/L — ABNORMAL HIGH (ref 0–44)
AST: 120 U/L — ABNORMAL HIGH (ref 15–41)
Albumin: 2.5 g/dL — ABNORMAL LOW (ref 3.5–5.0)
Alkaline Phosphatase: 153 U/L — ABNORMAL HIGH (ref 38–126)
Anion gap: 7 (ref 5–15)
BUN: 5 mg/dL — ABNORMAL LOW (ref 6–20)
CO2: 23 mmol/L (ref 22–32)
Calcium: 8.4 mg/dL — ABNORMAL LOW (ref 8.9–10.3)
Chloride: 108 mmol/L (ref 98–111)
Creatinine, Ser: 0.66 mg/dL (ref 0.44–1.00)
GFR, Estimated: >60 mL/min (ref >60)
Glucose, Bld: 81 mg/dL (ref 70–99)
Potassium: 3.7 mmol/L (ref 3.5–5.1)
Sodium: 138 mmol/L (ref 135–145)
Total Bilirubin: 0.4 mg/dL (ref 0.3–1.2)
Total Protein: 5.8 g/dL — ABNORMAL LOW (ref 6.5–8.1)

## 2021-08-18 MED ORDER — SIMETHICONE 80 MG PO CHEW
80.0000 mg | CHEWABLE_TABLET | Freq: Four times a day (QID) | ORAL | Status: DC
  Administered 2021-08-18 – 2021-08-19 (×2): 80 mg via ORAL
  Filled 2021-08-18 (×2): qty 1

## 2021-08-18 MED ORDER — NIFEDIPINE ER OSMOTIC RELEASE 30 MG PO TB24
30.0000 mg | ORAL_TABLET | Freq: Once | ORAL | Status: AC
  Administered 2021-08-18: 30 mg via ORAL
  Filled 2021-08-18: qty 1

## 2021-08-18 MED ORDER — LABETALOL HCL 200 MG PO TABS
200.0000 mg | ORAL_TABLET | Freq: Two times a day (BID) | ORAL | Status: DC
  Administered 2021-08-18: 200 mg via ORAL
  Filled 2021-08-18: qty 1

## 2021-08-18 MED ORDER — NIFEDIPINE ER 90 MG PO TB24
90.0000 mg | ORAL_TABLET | Freq: Every day | ORAL | 0 refills | Status: DC
  Filled 2021-08-18: qty 30, 30d supply, fill #0

## 2021-08-18 MED ORDER — NIFEDIPINE ER OSMOTIC RELEASE 60 MG PO TB24
90.0000 mg | ORAL_TABLET | Freq: Every day | ORAL | Status: DC
  Administered 2021-08-19: 90 mg via ORAL
  Filled 2021-08-18: qty 1

## 2021-08-18 MED ORDER — IBUPROFEN 600 MG PO TABS
600.0000 mg | ORAL_TABLET | Freq: Four times a day (QID) | ORAL | 0 refills | Status: DC
Start: 2021-08-18 — End: 2023-02-03
  Filled 2021-08-18: qty 30, 8d supply, fill #0

## 2021-08-18 NOTE — Progress Notes (Addendum)
Post Partum Day 3 Subjective: Patient reports a headache that improved with tylenol, ibuprofen and heat pad use. She denies changes in vision. She denies nausea or vomiting. She denies abdominal pain. Her vaginal bleeding is minimal.  She is ambulating and voiding without difficulty.    Objective: Blood pressure (!) 158/92, pulse (!) 132, temperature 98 F (36.7 C), temperature source Oral, resp. rate 17, height '5\' 1"'$  (1.549 m), weight 79.4 kg, last menstrual period 11/02/2020, SpO2 100 %, unknown if currently breastfeeding.     08/18/2021    4:20 PM 08/18/2021    4:00 PM 08/18/2021    2:01 PM  Vitals with BMI  Systolic 932 355 732  Diastolic 92 83 72  Pulse 202 130 121     Physical Exam:  General: alert, cooperative, and no distress CVS: s1, s2, RRR Pulmonary: Clear to auscultation bilaterally.  Lochia: appropriate Uterine Fundus: firm DVT Evaluation: No evidence of DVT seen on physical exam. Calf/Ankle edema is present 1+ bilaterally. 2+ patellar reflex bilaterally.    Recent Labs    08/17/21 0510 08/18/21 0616  HGB 9.6* 9.2*  HCT 26.3* 26.2*      Latest Ref Rng & Units 08/18/2021    6:16 AM 08/17/2021    5:10 AM 08/16/2021    4:29 AM  CBC  WBC 4.0 - 10.5 K/uL 6.7   9.0   10.5    Hemoglobin 12.0 - 15.0 g/dL 9.2   9.6   8.0    Hematocrit 36.0 - 46.0 % 26.2   26.3   22.0    Platelets 150 - 400 K/uL 169   146   104         Latest Ref Rng & Units 08/18/2021    6:16 AM 08/17/2021    5:10 AM 08/16/2021    4:29 AM  CMP  Glucose 70 - 99 mg/dL 81   97   74    BUN 6 - 20 mg/dL 5   <5   <5    Creatinine 0.44 - 1.00 mg/dL 0.66   0.67   0.76    Sodium 135 - 145 mmol/L 138   139   138    Potassium 3.5 - 5.1 mmol/L 3.7   3.7   4.2    Chloride 98 - 111 mmol/L 108   107   112    CO2 22 - 32 mmol/L '23   26   23    '$ Calcium 8.9 - 10.3 mg/dL 8.4   7.2   8.4    Total Protein 6.5 - 8.1 g/dL 5.8   5.9   4.6    Total Bilirubin 0.3 - 1.2 mg/dL 0.4   0.4   0.1    Alkaline Phos 38 - 126  U/L 153   162   148    AST 15 - 41 U/L 120   65   57    ALT 0 - 44 U/L 69   24   16      Assessment/Plan: 31 y/o G1P1 PPD # 3 after a vaginal delivery, complicated by postpartum hemorrhage, atypical HELLP, s/p 22 hours of postpartum magnesium sulfate and now on procardia (received 60 mg in AM then 30 mg in PM), with continued uncontrolled blood pressures,  - Will add labetalol 200 mg BID and will give procardia 90 mg Q daily in AM.  - AST continues to trend upwards but platelets has normalized, will recheck tomorrow. - Continue with current care.  LOS: 4 days  Archie Endo, MD 08/18/2021, 4:39 PM

## 2021-08-19 ENCOUNTER — Other Ambulatory Visit (HOSPITAL_COMMUNITY): Payer: Self-pay

## 2021-08-19 DIAGNOSIS — D509 Iron deficiency anemia, unspecified: Secondary | ICD-10-CM | POA: Diagnosis present

## 2021-08-19 DIAGNOSIS — O1424 HELLP syndrome, complicating childbirth: Secondary | ICD-10-CM | POA: Diagnosis not present

## 2021-08-19 LAB — COMPREHENSIVE METABOLIC PANEL
ALT: 77 U/L — ABNORMAL HIGH (ref 0–44)
AST: 108 U/L — ABNORMAL HIGH (ref 15–41)
Albumin: 2.9 g/dL — ABNORMAL LOW (ref 3.5–5.0)
Alkaline Phosphatase: 150 U/L — ABNORMAL HIGH (ref 38–126)
Anion gap: 8 (ref 5–15)
BUN: 6 mg/dL (ref 6–20)
CO2: 21 mmol/L — ABNORMAL LOW (ref 22–32)
Calcium: 8.7 mg/dL — ABNORMAL LOW (ref 8.9–10.3)
Chloride: 108 mmol/L (ref 98–111)
Creatinine, Ser: 0.62 mg/dL (ref 0.44–1.00)
GFR, Estimated: >60 mL/min (ref >60)
Glucose, Bld: 87 mg/dL (ref 70–99)
Potassium: 4.2 mmol/L (ref 3.5–5.1)
Sodium: 137 mmol/L (ref 135–145)
Total Bilirubin: 0.4 mg/dL (ref 0.3–1.2)
Total Protein: 6.3 g/dL — ABNORMAL LOW (ref 6.5–8.1)

## 2021-08-19 MED ORDER — LABETALOL HCL 200 MG PO TABS
200.0000 mg | ORAL_TABLET | Freq: Two times a day (BID) | ORAL | 0 refills | Status: DC
  Filled 2021-08-19: qty 28, 14d supply, fill #0

## 2021-08-19 MED ORDER — SENNOSIDES-DOCUSATE SODIUM 8.6-50 MG PO TABS
2.0000 | ORAL_TABLET | Freq: Two times a day (BID) | ORAL | 4 refills | Status: DC | PRN
  Filled 2021-08-19: qty 30, 8d supply, fill #0

## 2021-08-19 MED ORDER — HYDROCORTISONE ACETATE 25 MG RE SUPP
25.0000 mg | Freq: Two times a day (BID) | RECTAL | 5 refills | Status: DC
  Filled 2021-08-19: qty 12, 6d supply, fill #0

## 2021-08-19 MED ORDER — ACETAMINOPHEN 325 MG PO TABS: 650.0000 mg | ORAL_TABLET | ORAL | Status: DC | PRN

## 2021-08-19 MED ORDER — DIBUCAINE (PERIANAL) 1 % EX OINT
1.0000 "application " | TOPICAL_OINTMENT | CUTANEOUS | 4 refills | Status: DC | PRN
  Filled 2021-08-19: qty 28, fill #0

## 2021-08-19 NOTE — Progress Notes (Signed)
Discharge confirmed by Dr. Alwyn Pea

## 2021-08-19 NOTE — Discharge Summary (Addendum)
Postpartum Discharge Summary  Date of Service updated 08/19/21    Patient Name: Morgan Sanchez DOB: 1991/02/02 MRN: [id number]  Date of admission: 08/14/2021 Delivery date:08/15/2021  Delivering provider: Sanjuana Kava  Date of discharge: 08/19/2021  Admitting diagnosis: Post-dates pregnancy [O48.0] Intrauterine pregnancy: [redacted]w[redacted]d    Secondary diagnosis:  Principal Problem:   Severe pre-eclampsia, with delivery Active Problems:   Post-dates pregnancy   IDA (iron deficiency anemia)   PPH (postpartum hemorrhage)   HELLP syndrome, delivered, current hospitalization  Additional problems: Iron deficiency anemia    Discharge diagnosis: Term Pregnancy Delivered, Preeclampsia (severe), and Anemia                                              Post partum procedures: Magnesium therapy for 22 hours Augmentation: Pitocin Complications: Postpartum hemorrhage with 952 mL blood loss  Hospital course: Onset of Labor With Vaginal Delivery      31y.o. yo G1P1001 at 416w2das admitted in Latent Labor on 08/14/2021. Patient had a labor course complicated by severe range  blood pressures and elevated liver enzymes. Atypical HELLP syndrome treated with Magnesium Sulfate for 22 hours after delivery. Immediate postpartum hemorrhage treated with Tranexamic acid administered and 1000 mcg of misoprostol with good response and pt remained hemodynamically stable. Blood pressures did not respond to initial Procardia XL dose of 30 mg daily. Strength increased from 60 to 90 mg daily and Labetalol 200 mg PO BID added. Blood pressure values are normotensive at time of discharge and neuro symptoms are absent.   Membrane Rupture Time/Date: 7:30 AM ,08/14/2021   Delivery Method:Vaginal, Spontaneous  Episiotomy: None  Lacerations:  2nd degree;Sulcus  Patient had an uncomplicated postpartum course.  She is ambulating, tolerating a regular diet, passing flatus, and urinating well. Patient is discharged home in stable  condition on 08/19/21.  Newborn Data: Birth date:08/15/2021  Birth time:6:18 AM  Gender:Female  Living status:Living  Apgars:7 ,9  Weight:3210 g   Magnesium Sulfate received: Yes: Seizure prophylaxis BMZ received: No Rhophylac:N/A MMR:N/A T-DaP:Given prenatally Flu: Yes Transfusion:No  Physical exam  Vitals:   08/18/21 2332 08/19/21 0343 08/19/21 0739 08/19/21 0902  BP: 126/71 114/71  111/72  Pulse: (!) 132 99  (!) 106  Resp: 19 18 16 16   Temp: 98.4 F (36.9 C) 97.7 F (36.5 C)  98.6 F (37 C)  TempSrc: Oral Oral  Oral  SpO2: 100% 100%    Weight:      Height:       General: alert, cooperative, and no distress Lochia: appropriate Uterine Fundus: firm Incision: N/A DVT Evaluation: No evidence of DVT seen on physical exam. No cords or calf tenderness. No significant calf/ankle edema. Labs: Lab Results  Component Value Date   WBC 6.7 08/18/2021   HGB 9.2 (L) 08/18/2021   HCT 26.2 (L) 08/18/2021   MCV 79.6 (L) 08/18/2021   PLT 169 08/18/2021      Latest Ref Rng & Units 08/19/2021    6:58 AM  CMP  Glucose 70 - 99 mg/dL 87    BUN 6 - 20 mg/dL 6    Creatinine 0.44 - 1.00 mg/dL 0.62    Sodium 135 - 145 mmol/L 137    Potassium 3.5 - 5.1 mmol/L 4.2    Chloride 98 - 111 mmol/L 108    CO2 22 - 32 mmol/L 21  Calcium 8.9 - 10.3 mg/dL 8.7    Total Protein 6.5 - 8.1 g/dL 6.3    Total Bilirubin 0.3 - 1.2 mg/dL 0.4    Alkaline Phos 38 - 126 U/L 150    AST 15 - 41 U/L 108    ALT 0 - 44 U/L 77     Edinburgh Score:    08/16/2021    6:11 AM  Edinburgh Postnatal Depression Scale Screening Tool  I have been able to laugh and see the funny side of things. 0  I have looked forward with enjoyment to things. 0  I have blamed myself unnecessarily when things went wrong. 2  I have been anxious or worried for no good reason. 0  I have felt scared or panicky for no good reason. 0  Things have been getting on top of me. 0  I have been so unhappy that I have had difficulty  sleeping. 0  I have felt sad or miserable. 0  I have been so unhappy that I have been crying. 0  The thought of harming myself has occurred to me. 0  Edinburgh Postnatal Depression Scale Total 2      After visit meds:  Allergies as of 08/19/2021       Reactions   Cherry Anaphylaxis   Latex Rash        Medication List     STOP taking these medications    ondansetron 4 MG tablet Commonly known as: ZOFRAN       TAKE these medications    acetaminophen 325 MG tablet Commonly known as: Tylenol Take 2 tablets (650 mg total) by mouth every 4 (four) hours as needed for headache or fever (for pain scale < 4).   dibucaine 1 % Oint Commonly known as: NUPERCAINAL Place 1 application. rectally as needed for hemorrhoids or anal irritation.   hydrocortisone 25 MG suppository Commonly known as: ANUSOL-HC Place 1 suppository (25 mg total) rectally 2 (two) times daily.   ibuprofen 600 MG tablet Commonly known as: ADVIL Take 1 tablet (600 mg total) by mouth every 6 (six) hours.   Iron 100/C 100-250 MG Tabs Generic drug: Iron-Vitamin C Take by mouth.   multivitamin-prenatal 27-0.8 MG Tabs tablet Take 1 tablet by mouth daily at 12 noon.   NIFEdipine 90 MG 24 hr tablet Commonly known as: ADALAT CC Take 1 tablet (90 mg total) by mouth daily. Notes to patient: Last dose taken 5/24 at 10 am    senna-docusate 8.6-50 MG tablet Commonly known as: Senokot-S Take 2 tablets by mouth 2 (two) times daily as needed for mild constipation.         Discharge home in stable condition Infant Feeding:  Breast and formula Infant Disposition:home with mother Discharge instruction: per After Visit Summary and Postpartum booklet. Activity: Advance as tolerated. Pelvic rest for 6 weeks.  Diet: low salt diet Anticipated Birth Control:  declines contraception, aware of options Postpartum Appointment:6 weeks Additional Postpartum F/U: BLOOD PRESSURE CHECK AND LABS THIS Friday LATEST  MONDAY Future Appointments:No future appointments. Follow up Visit:  [facility] Follow up in 1 week(s).   Specialty: Obstetrics and Gynecology Why: Blood pressure and labs follow up visit. Contact information: [address] [place] [address] [facility]. Schedule an appointment as soon as possible for a visit in 6 week(s).   Specialty: Obstetrics and Gynecology  Contact information: [address]. [address] [place] [address] [phone]                    08/19/2021 Sanjuana Kava, MD

## 2021-08-19 NOTE — Progress Notes (Signed)
MD POSTPARTUM NOTE  PPD# 4 s/p SVD diagnosed with preeclampsia with severe features,  Atypical HELLP and had a postpartum hemorrhage.    Subjective: Today patient denies headache or blurry vision, she denies right upper quadrant Pain or epigastric pain No nausea or vomiting, she is tolerating po Bleeding is light Pain controlled with ibuprofen Up ad lib / ambulatory / voiding w/o difficulty Bottle and Breast    Objective:  Vitals: BP 111/72 (BP Location: Right Arm)   Pulse (!) 106   Temp 98.6 F (37 C) (Oral)   Resp 16   Ht '5\' 1"'$  (1.549 m)   Wt 79.4 kg   LMP 11/02/2020 (Exact Date)   SpO2 100%   Breastfeeding Unknown   BMI 33.08 kg/m   LABS:     Latest Ref Rng & Units 08/19/2021    6:58 AM 08/18/2021    6:16 AM 08/17/2021    5:10 AM  CMP  Glucose 70 - 99 mg/dL 87   81   97    BUN 6 - 20 mg/dL 6   5   <5    Creatinine 0.44 - 1.00 mg/dL 0.62   0.66   0.67    Sodium 135 - 145 mmol/L 137   138   139    Potassium 3.5 - 5.1 mmol/L 4.2   3.7   3.7    Chloride 98 - 111 mmol/L 108   108   107    CO2 22 - 32 mmol/L '21   23   26    '$ Calcium 8.9 - 10.3 mg/dL 8.7   8.4   7.2    Total Protein 6.5 - 8.1 g/dL 6.3   5.8   5.9    Total Bilirubin 0.3 - 1.2 mg/dL 0.4   0.4   0.4    Alkaline Phos 38 - 126 U/L 150   153   162    AST 15 - 41 U/L 108   120   65    ALT 0 - 44 U/L 77   69   24         Latest Ref Rng & Units 08/18/2021    6:16 AM 08/17/2021    5:10 AM 08/16/2021    4:29 AM  CBC  WBC 4.0 - 10.5 K/uL 6.7   9.0   10.5    Hemoglobin 12.0 - 15.0 g/dL 9.2   9.6   8.0    Hematocrit 36.0 - 46.0 % 26.2   26.3   22.0    Platelets 150 - 400 K/uL 169   146   104        Physical Exam: Alert and oriented X3 Lungs: Clear and unlabored Heart: regular rate and rhythm / no mumurs Abdomen: soft, non-tender, non-distended  Fundus: firm, non-tender, U-1 Perineum: intact Lochia: minimal Extremities: no edema, no calf pain or tenderness Neuro: No clonus, normal DTRs      Assessment/Plan: 31 year old G1P1 PPD # 4 s/p SVD s/p PPH, Preeclampsia w/ severe features atypical HELLP And labile blood pressures now controlled on Procardia 90 mg daily. Patient doing well clinically.  Discussed case with MFM, Dr. Annamaria Boots and patient is able to be discharged home with  Preeclampsia precautions and follow up in the office this Friday (the latest Monday) She will be discharged with Procardia '90mg'$  xr    Sanjuana Kava MD 08/19/2021 11:37 AM

## 2021-08-20 ENCOUNTER — Inpatient Hospital Stay (HOSPITAL_COMMUNITY): Payer: BC Managed Care – PPO

## 2021-08-20 ENCOUNTER — Inpatient Hospital Stay (HOSPITAL_COMMUNITY)
Admission: AD | Admit: 2021-08-20 | Payer: BC Managed Care – PPO | Source: Home / Self Care | Admitting: Obstetrics & Gynecology

## 2021-08-20 LAB — CBC
HCT: 28.3 % — ABNORMAL LOW (ref 36.0–46.0)
Hemoglobin: 10 g/dL — ABNORMAL LOW (ref 12.0–15.0)
MCH: 28.4 pg (ref 26.0–34.0)
MCHC: 35.3 g/dL (ref 30.0–36.0)
MCV: 80.4 fL (ref 80.0–100.0)
Platelets: 257 10*3/uL (ref 150–400)
RBC: 3.52 MIL/uL — ABNORMAL LOW (ref 3.87–5.11)
RDW: 17.4 % — ABNORMAL HIGH (ref 11.5–15.5)
WBC: 6.5 10*3/uL (ref 4.0–10.5)
nRBC: 0 % (ref 0.0–0.2)

## 2021-08-21 DIAGNOSIS — R8279 Other abnormal findings on microbiological examination of urine: Secondary | ICD-10-CM | POA: Diagnosis not present

## 2021-08-21 DIAGNOSIS — R748 Abnormal levels of other serum enzymes: Secondary | ICD-10-CM | POA: Diagnosis not present

## 2021-08-25 ENCOUNTER — Telehealth (HOSPITAL_COMMUNITY): Payer: Self-pay | Admitting: *Deleted

## 2021-08-25 NOTE — Telephone Encounter (Signed)
Patient voiced no questions or concerns regarding her health at this time. EPDS=5. Patient voiced no questions or concerns regarding infant at this time. Patient reports infant sleeps in a bassinet on her back. RN reviewed ABCs of safe sleep. Patient verbalized understanding. Patient requested RN email information on hospital's virtual postpartum classes and support groups. Email sent. Erline Levine, RN, 08/25/21. [phone]

## 2021-09-03 DIAGNOSIS — Z8759 Personal history of other complications of pregnancy, childbirth and the puerperium: Secondary | ICD-10-CM | POA: Diagnosis not present

## 2021-09-03 DIAGNOSIS — R300 Dysuria: Secondary | ICD-10-CM | POA: Diagnosis not present

## 2021-09-03 DIAGNOSIS — Z1332 Encounter for screening for maternal depression: Secondary | ICD-10-CM | POA: Diagnosis not present

## 2021-09-03 DIAGNOSIS — N898 Other specified noninflammatory disorders of vagina: Secondary | ICD-10-CM | POA: Diagnosis not present

## 2021-09-03 DIAGNOSIS — O1420 HELLP syndrome (HELLP), unspecified trimester: Secondary | ICD-10-CM | POA: Diagnosis not present

## 2021-09-03 DIAGNOSIS — L989 Disorder of the skin and subcutaneous tissue, unspecified: Secondary | ICD-10-CM | POA: Diagnosis not present

## 2021-09-16 DIAGNOSIS — F900 Attention-deficit hyperactivity disorder, predominantly inattentive type: Secondary | ICD-10-CM | POA: Diagnosis not present

## 2021-09-16 DIAGNOSIS — F331 Major depressive disorder, recurrent, moderate: Secondary | ICD-10-CM | POA: Diagnosis not present

## 2021-09-21 DIAGNOSIS — F331 Major depressive disorder, recurrent, moderate: Secondary | ICD-10-CM | POA: Diagnosis not present

## 2021-09-21 DIAGNOSIS — F900 Attention-deficit hyperactivity disorder, predominantly inattentive type: Secondary | ICD-10-CM | POA: Diagnosis not present

## 2021-09-22 DIAGNOSIS — N898 Other specified noninflammatory disorders of vagina: Secondary | ICD-10-CM | POA: Diagnosis not present

## 2021-09-22 DIAGNOSIS — D259 Leiomyoma of uterus, unspecified: Secondary | ICD-10-CM | POA: Diagnosis not present

## 2021-09-22 DIAGNOSIS — Z3040 Encounter for surveillance of contraceptives, unspecified: Secondary | ICD-10-CM | POA: Diagnosis not present

## 2021-09-28 DIAGNOSIS — F900 Attention-deficit hyperactivity disorder, predominantly inattentive type: Secondary | ICD-10-CM | POA: Diagnosis not present

## 2021-09-28 DIAGNOSIS — F331 Major depressive disorder, recurrent, moderate: Secondary | ICD-10-CM | POA: Diagnosis not present

## 2021-10-06 DIAGNOSIS — F900 Attention-deficit hyperactivity disorder, predominantly inattentive type: Secondary | ICD-10-CM | POA: Diagnosis not present

## 2021-10-06 DIAGNOSIS — F331 Major depressive disorder, recurrent, moderate: Secondary | ICD-10-CM | POA: Diagnosis not present

## 2021-11-12 DIAGNOSIS — F900 Attention-deficit hyperactivity disorder, predominantly inattentive type: Secondary | ICD-10-CM | POA: Diagnosis not present

## 2021-11-12 DIAGNOSIS — F331 Major depressive disorder, recurrent, moderate: Secondary | ICD-10-CM | POA: Diagnosis not present

## 2021-11-13 DIAGNOSIS — D252 Subserosal leiomyoma of uterus: Secondary | ICD-10-CM | POA: Diagnosis not present

## 2021-11-26 DIAGNOSIS — F331 Major depressive disorder, recurrent, moderate: Secondary | ICD-10-CM | POA: Diagnosis not present

## 2021-11-26 DIAGNOSIS — F900 Attention-deficit hyperactivity disorder, predominantly inattentive type: Secondary | ICD-10-CM | POA: Diagnosis not present

## 2021-12-10 DIAGNOSIS — F331 Major depressive disorder, recurrent, moderate: Secondary | ICD-10-CM | POA: Diagnosis not present

## 2021-12-10 DIAGNOSIS — F900 Attention-deficit hyperactivity disorder, predominantly inattentive type: Secondary | ICD-10-CM | POA: Diagnosis not present

## 2021-12-24 DIAGNOSIS — F331 Major depressive disorder, recurrent, moderate: Secondary | ICD-10-CM | POA: Diagnosis not present

## 2021-12-24 DIAGNOSIS — F900 Attention-deficit hyperactivity disorder, predominantly inattentive type: Secondary | ICD-10-CM | POA: Diagnosis not present

## 2022-01-07 DIAGNOSIS — F900 Attention-deficit hyperactivity disorder, predominantly inattentive type: Secondary | ICD-10-CM | POA: Diagnosis not present

## 2022-01-07 DIAGNOSIS — F331 Major depressive disorder, recurrent, moderate: Secondary | ICD-10-CM | POA: Diagnosis not present

## 2022-01-12 DIAGNOSIS — F900 Attention-deficit hyperactivity disorder, predominantly inattentive type: Secondary | ICD-10-CM | POA: Diagnosis not present

## 2022-01-12 DIAGNOSIS — F331 Major depressive disorder, recurrent, moderate: Secondary | ICD-10-CM | POA: Diagnosis not present

## 2022-01-21 DIAGNOSIS — F900 Attention-deficit hyperactivity disorder, predominantly inattentive type: Secondary | ICD-10-CM | POA: Diagnosis not present

## 2022-01-21 DIAGNOSIS — F331 Major depressive disorder, recurrent, moderate: Secondary | ICD-10-CM | POA: Diagnosis not present

## 2022-02-04 DIAGNOSIS — F331 Major depressive disorder, recurrent, moderate: Secondary | ICD-10-CM | POA: Diagnosis not present

## 2022-02-04 DIAGNOSIS — F900 Attention-deficit hyperactivity disorder, predominantly inattentive type: Secondary | ICD-10-CM | POA: Diagnosis not present

## 2022-02-25 DIAGNOSIS — F900 Attention-deficit hyperactivity disorder, predominantly inattentive type: Secondary | ICD-10-CM | POA: Diagnosis not present

## 2022-02-25 DIAGNOSIS — F331 Major depressive disorder, recurrent, moderate: Secondary | ICD-10-CM | POA: Diagnosis not present

## 2022-03-04 DIAGNOSIS — Z01411 Encounter for gynecological examination (general) (routine) with abnormal findings: Secondary | ICD-10-CM | POA: Diagnosis not present

## 2022-03-04 DIAGNOSIS — R109 Unspecified abdominal pain: Secondary | ICD-10-CM | POA: Diagnosis not present

## 2022-03-04 DIAGNOSIS — N952 Postmenopausal atrophic vaginitis: Secondary | ICD-10-CM | POA: Diagnosis not present

## 2022-03-04 DIAGNOSIS — Z124 Encounter for screening for malignant neoplasm of cervix: Secondary | ICD-10-CM | POA: Diagnosis not present

## 2022-03-04 DIAGNOSIS — R5383 Other fatigue: Secondary | ICD-10-CM | POA: Diagnosis not present

## 2022-03-11 DIAGNOSIS — F900 Attention-deficit hyperactivity disorder, predominantly inattentive type: Secondary | ICD-10-CM | POA: Diagnosis not present

## 2022-03-11 DIAGNOSIS — F331 Major depressive disorder, recurrent, moderate: Secondary | ICD-10-CM | POA: Diagnosis not present

## 2022-03-16 ENCOUNTER — Other Ambulatory Visit: Payer: Self-pay | Admitting: Obstetrics & Gynecology

## 2022-03-16 DIAGNOSIS — R109 Unspecified abdominal pain: Secondary | ICD-10-CM

## 2022-03-18 DIAGNOSIS — F331 Major depressive disorder, recurrent, moderate: Secondary | ICD-10-CM | POA: Diagnosis not present

## 2022-03-18 DIAGNOSIS — F900 Attention-deficit hyperactivity disorder, predominantly inattentive type: Secondary | ICD-10-CM | POA: Diagnosis not present

## 2022-03-29 NOTE — L&D Delivery Note (Signed)
[facility]N [facility] Delivery Note  Morgan Sanchez is a 32 y.o. G2P1001 s/p VD at [redacted]w[redacted]d. She was admitted for SOL.   ROM: 10h 75m with clear fluid GBS Status:  Negative/-- (08/22 0000) Maximum Maternal Temperature: 98.9  Labor Progress: Initial SVE: 5.5/80/-2. She then progressed to complete.   Delivery Date/Time: 12/05/2022 @1651  Delivery: Called to room and patient was complete and pushing. Head delivered MOA. Loose nuchal cord present x1; reduced. Shoulder and body delivered in usual fashion. Infant with spontaneous cry, placed on mother's abdomen, dried and stimulated. Cord clamped x 2 after 1-minute delay, and cut by FOB. Cord blood drawn. Placenta delivered spontaneously with gentle cord traction. Fundus firm with massage and Pitocin. Labia, perineum, vagina, and cervix inspected. There was a non-hemostatic 2nd degree laceration that was repaired with good hemostasis. TXA and cytotec PR given due to hx of PPH and continued brisk uterine bleeding following perineal repair.  Baby Weight: pending  Placenta: 3 vessel, intact. Sent with patient Complications: None Lacerations: 2nd degree laceration; repaired with 3.0 vicryl EBL: 240 mL Analgesia: Epidural   Infant:  APGAR (1 MIN): 9  APGAR (5 MINS): 9   Rajah Lamba Autry-Lott, DO 12/05/2022, 5:37 PM [facility]

## 2022-03-30 DIAGNOSIS — F331 Major depressive disorder, recurrent, moderate: Secondary | ICD-10-CM | POA: Diagnosis not present

## 2022-03-30 DIAGNOSIS — F900 Attention-deficit hyperactivity disorder, predominantly inattentive type: Secondary | ICD-10-CM | POA: Diagnosis not present

## 2022-04-10 DIAGNOSIS — J019 Acute sinusitis, unspecified: Secondary | ICD-10-CM | POA: Diagnosis not present

## 2022-04-10 DIAGNOSIS — B9689 Other specified bacterial agents as the cause of diseases classified elsewhere: Secondary | ICD-10-CM | POA: Diagnosis not present

## 2022-04-10 DIAGNOSIS — R0981 Nasal congestion: Secondary | ICD-10-CM | POA: Diagnosis not present

## 2022-04-22 DIAGNOSIS — F900 Attention-deficit hyperactivity disorder, predominantly inattentive type: Secondary | ICD-10-CM | POA: Diagnosis not present

## 2022-04-22 DIAGNOSIS — F331 Major depressive disorder, recurrent, moderate: Secondary | ICD-10-CM | POA: Diagnosis not present

## 2022-05-06 DIAGNOSIS — F900 Attention-deficit hyperactivity disorder, predominantly inattentive type: Secondary | ICD-10-CM | POA: Diagnosis not present

## 2022-05-06 DIAGNOSIS — F331 Major depressive disorder, recurrent, moderate: Secondary | ICD-10-CM | POA: Diagnosis not present

## 2022-05-17 DIAGNOSIS — N912 Amenorrhea, unspecified: Secondary | ICD-10-CM | POA: Diagnosis not present

## 2022-05-17 DIAGNOSIS — N925 Other specified irregular menstruation: Secondary | ICD-10-CM | POA: Diagnosis not present

## 2022-05-17 DIAGNOSIS — K589 Irritable bowel syndrome without diarrhea: Secondary | ICD-10-CM | POA: Diagnosis not present

## 2022-05-17 DIAGNOSIS — F418 Other specified anxiety disorders: Secondary | ICD-10-CM | POA: Diagnosis not present

## 2022-05-17 DIAGNOSIS — D582 Other hemoglobinopathies: Secondary | ICD-10-CM | POA: Diagnosis not present

## 2022-06-14 DIAGNOSIS — D582 Other hemoglobinopathies: Secondary | ICD-10-CM | POA: Diagnosis not present

## 2022-06-14 DIAGNOSIS — N925 Other specified irregular menstruation: Secondary | ICD-10-CM | POA: Diagnosis not present

## 2022-06-14 DIAGNOSIS — F418 Other specified anxiety disorders: Secondary | ICD-10-CM | POA: Diagnosis not present

## 2022-06-14 DIAGNOSIS — Z113 Encounter for screening for infections with a predominantly sexual mode of transmission: Secondary | ICD-10-CM | POA: Diagnosis not present

## 2022-06-14 LAB — OB RESULTS CONSOLE RPR: RPR: NONREACTIVE

## 2022-06-14 LAB — OB RESULTS CONSOLE RUBELLA ANTIBODY, IGM: Rubella: IMMUNE

## 2022-06-14 LAB — OB RESULTS CONSOLE GC/CHLAMYDIA
Chlamydia: NEGATIVE
Neisseria Gonorrhea: NEGATIVE

## 2022-06-14 LAB — OB RESULTS CONSOLE HEPATITIS B SURFACE ANTIGEN: Hepatitis B Surface Ag: NEGATIVE

## 2022-06-14 LAB — OB RESULTS CONSOLE HIV ANTIBODY (ROUTINE TESTING): HIV: NONREACTIVE

## 2022-07-19 ENCOUNTER — Other Ambulatory Visit: Payer: Self-pay | Admitting: Obstetrics & Gynecology

## 2022-07-19 ENCOUNTER — Encounter: Payer: Self-pay | Admitting: Obstetrics & Gynecology

## 2022-07-19 DIAGNOSIS — Z363 Encounter for antenatal screening for malformations: Secondary | ICD-10-CM

## 2022-08-11 ENCOUNTER — Encounter: Payer: Self-pay | Admitting: *Deleted

## 2022-08-12 ENCOUNTER — Other Ambulatory Visit: Payer: Self-pay

## 2022-08-12 ENCOUNTER — Ambulatory Visit: Payer: BC Managed Care – PPO

## 2022-08-12 ENCOUNTER — Ambulatory Visit: Payer: BC Managed Care – PPO | Attending: Obstetrics & Gynecology

## 2022-08-12 VITALS — BP 115/63 | HR 99

## 2022-08-12 DIAGNOSIS — Z362 Encounter for other antenatal screening follow-up: Secondary | ICD-10-CM

## 2022-08-12 DIAGNOSIS — Z3689 Encounter for other specified antenatal screening: Secondary | ICD-10-CM

## 2022-08-12 DIAGNOSIS — Z363 Encounter for antenatal screening for malformations: Secondary | ICD-10-CM | POA: Insufficient documentation

## 2022-08-12 DIAGNOSIS — O99212 Obesity complicating pregnancy, second trimester: Secondary | ICD-10-CM

## 2022-08-12 DIAGNOSIS — A498 Other bacterial infections of unspecified site: Secondary | ICD-10-CM | POA: Diagnosis not present

## 2022-08-12 DIAGNOSIS — Z8759 Personal history of other complications of pregnancy, childbirth and the puerperium: Secondary | ICD-10-CM

## 2022-08-12 IMAGING — US US OB COMP LESS 14 WK
1 series · 15 of 28 positions shown · non-contrast
Comparison: None.

CLINICAL DATA: Nausea vomiting

EXAM:
OBSTETRIC <14 WK ULTRASOUND
TECHNIQUE: Transabdominal ultrasound was performed for evaluation of the
gestation as well as the maternal uterus and adnexal regions.

[Series 1: us ob comp less 14 wk · 15 of 34 slices shown]
[im 1/34]
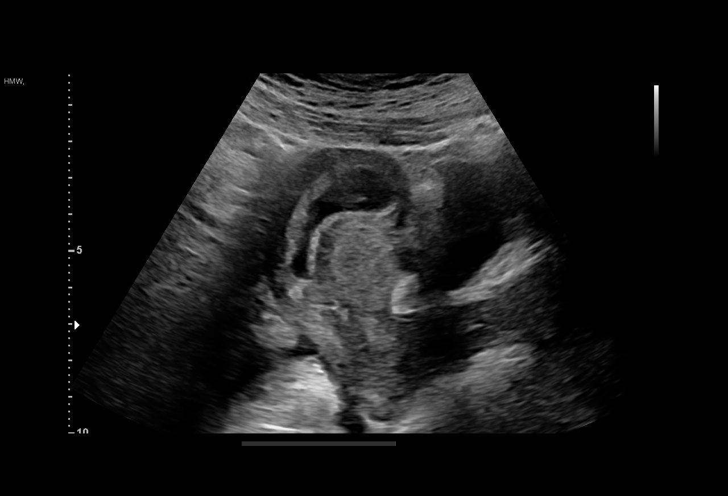
[im 3/34]
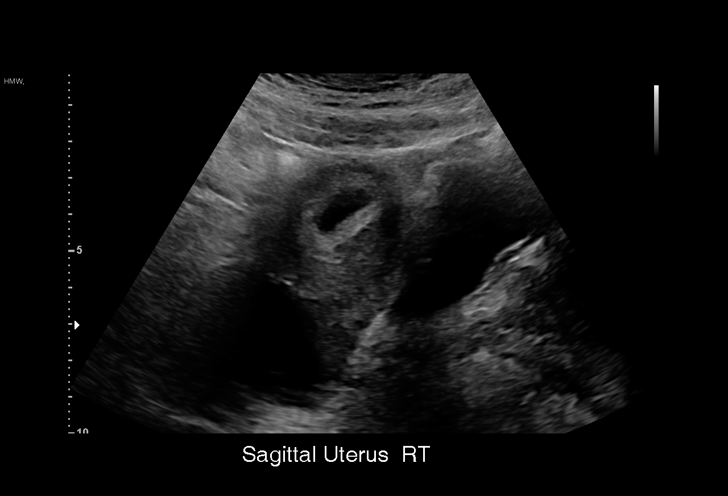
[im 5/34]
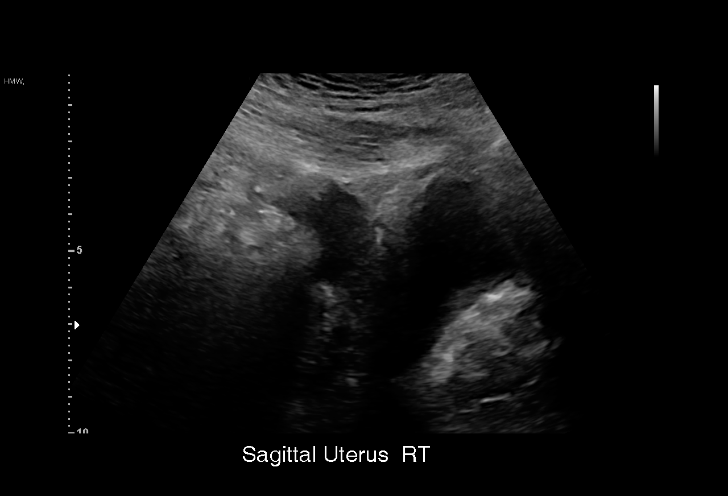
[im 8/34]
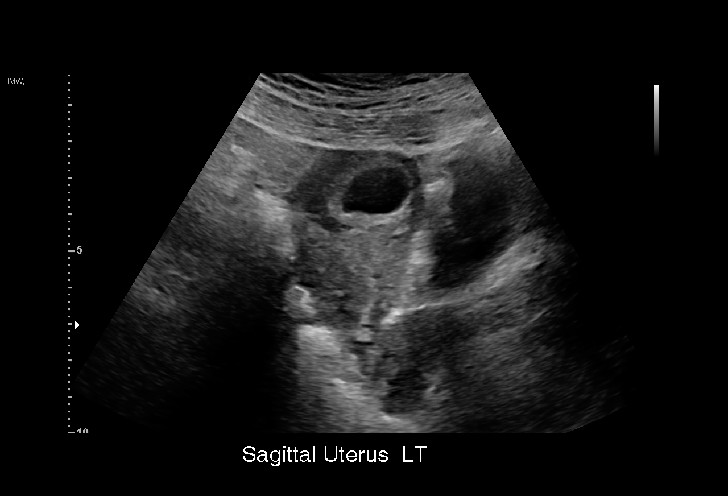
[im 10/34]
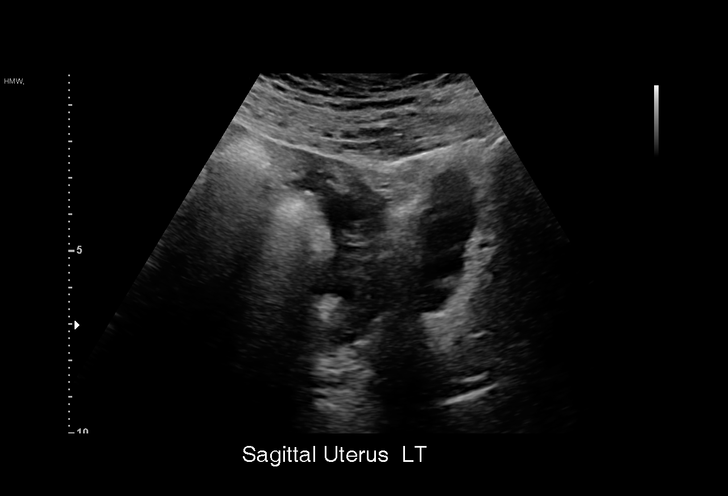
[im 13/34]
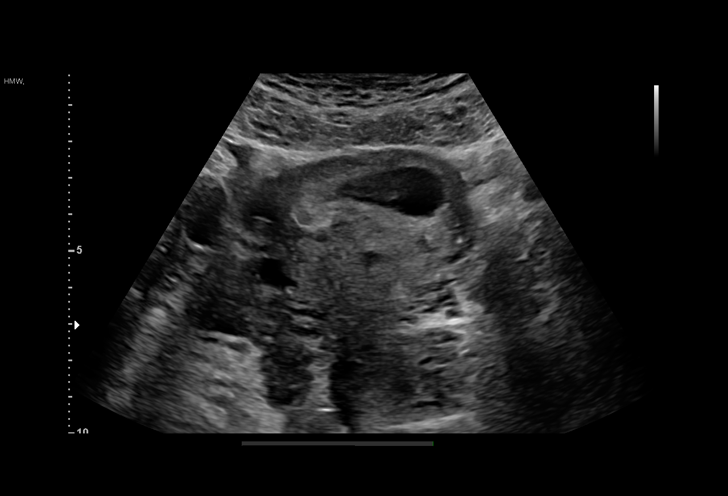
[im 15/34]
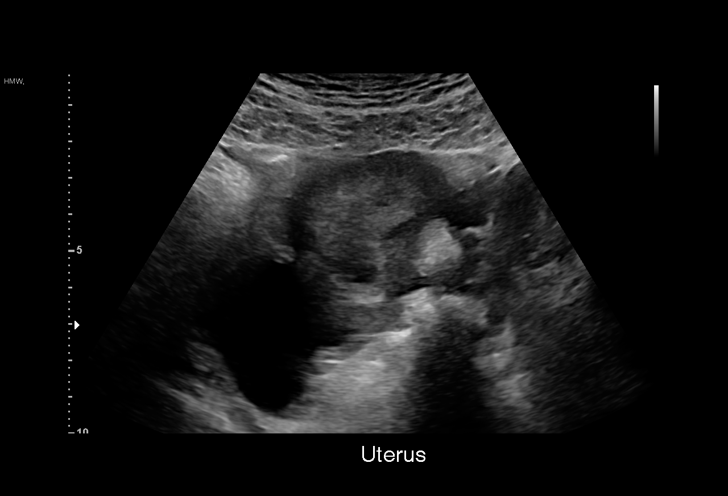
[im 18/34]
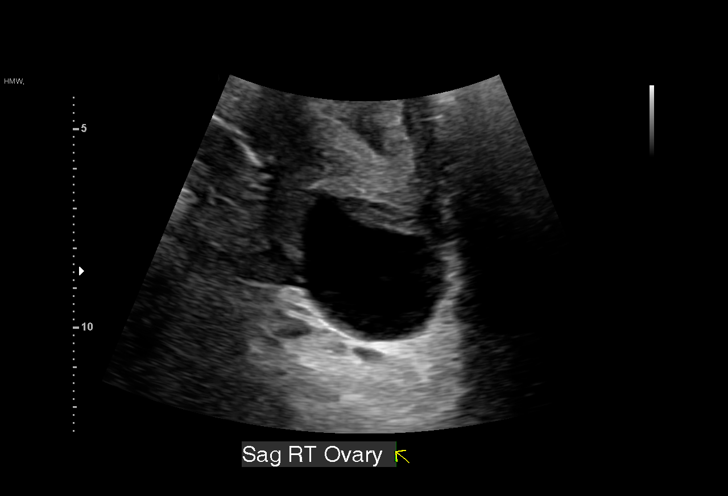
[im 19/34]
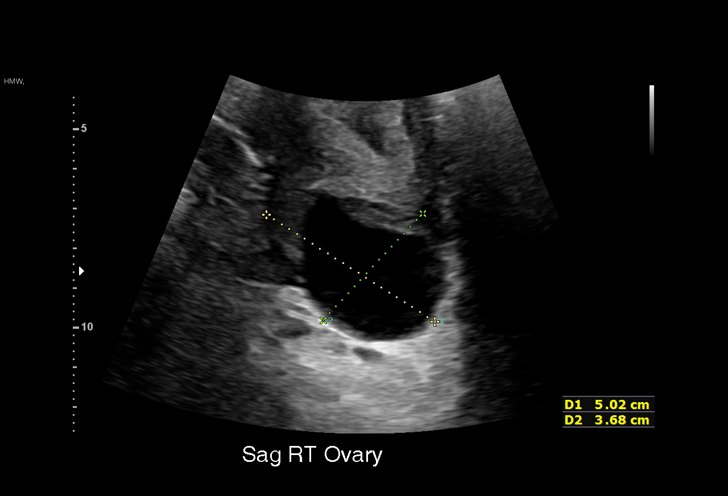
[im 21/34]
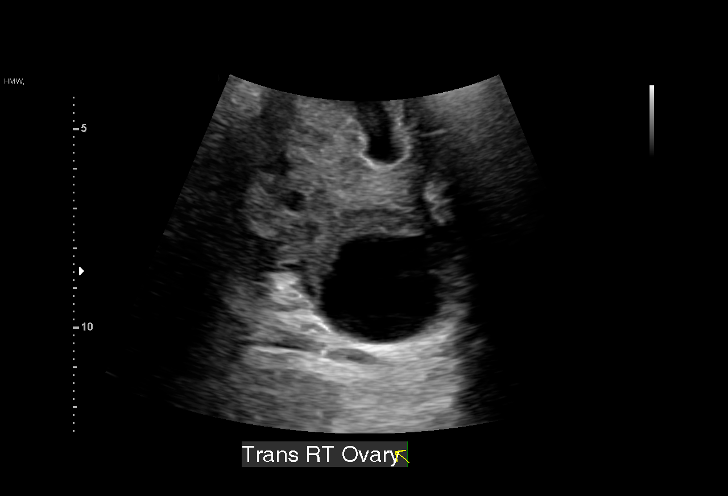
[im 24/34]
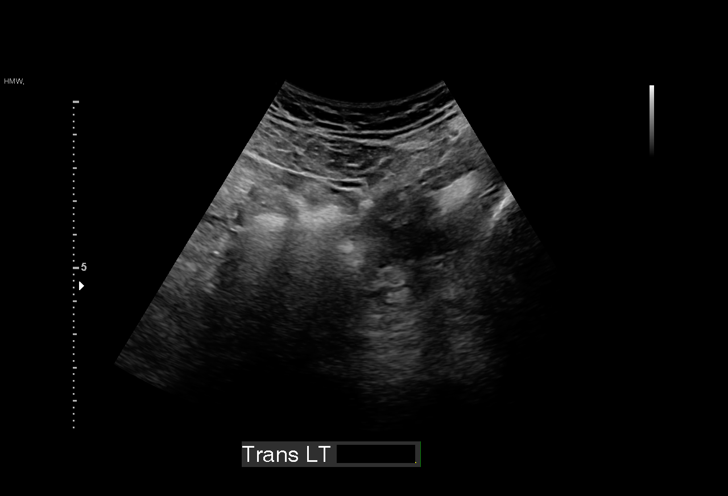
[im 26/34]
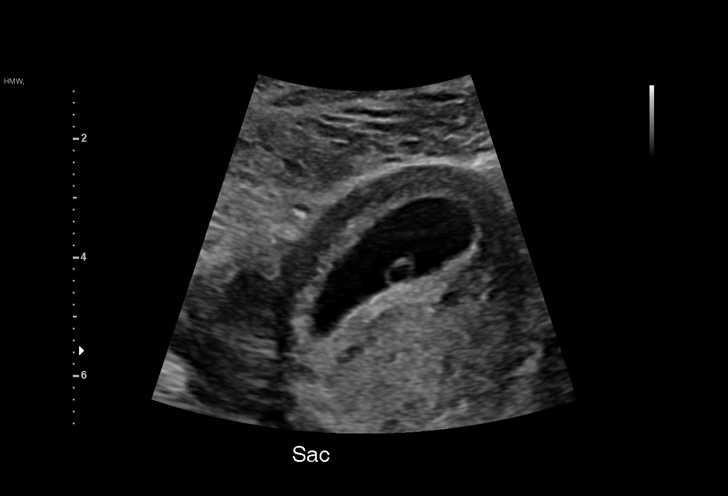
[im 29/34]
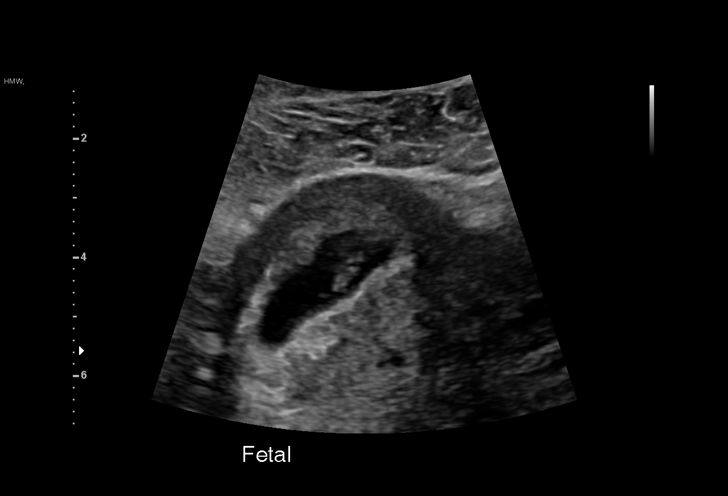
[im 31/34]
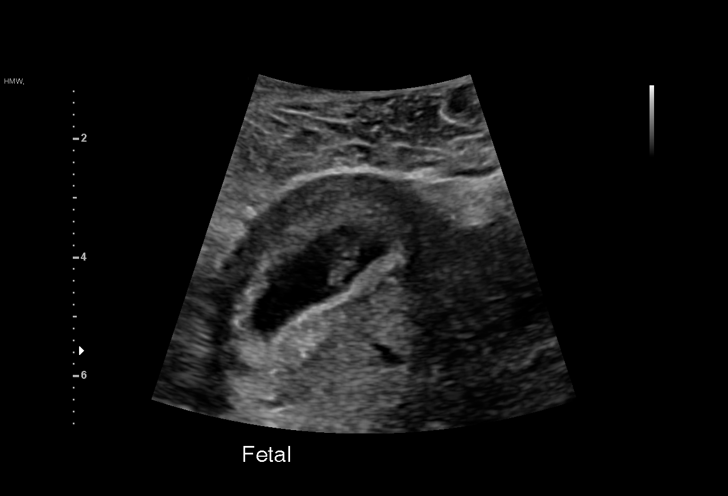
[im 34/34]
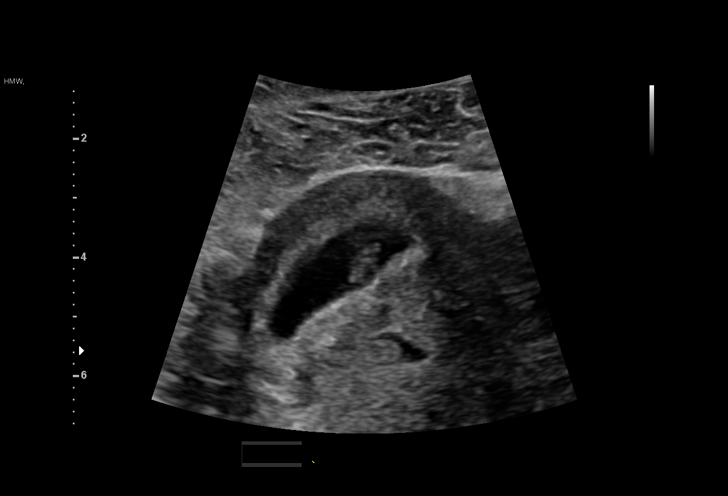

[15 of 28 positions shown; findings below may reference images not displayed]

FINDINGS: Intrauterine gestational sac: Single

Yolk sac:  Visualized

Embryo:  Visualized

Cardiac Activity: Visualized

Heart Rate: 135 bpm

CRL:   8.3 mm   6 w 5 d                  US EDC: 08/13/2021

Subchorionic hemorrhage:  None visualized.

Maternal uterus/adnexae: Nonvisualized left ovary. Right ovary
measures 5 x 3.7 by 3.9 cm.
IMPRESSION: Single viable intrauterine pregnancy with estimated sonographic age
of 6 weeks 5 days and ultrasound EDC of 08/13/2021. Otherwise no
specific abnormality is seen.

## 2022-09-27 DIAGNOSIS — Z362 Encounter for other antenatal screening follow-up: Secondary | ICD-10-CM | POA: Diagnosis not present

## 2022-10-21 ENCOUNTER — Ambulatory Visit: Payer: BC Managed Care – PPO

## 2022-11-11 DIAGNOSIS — Z364 Encounter for antenatal screening for fetal growth retardation: Secondary | ICD-10-CM | POA: Diagnosis not present

## 2022-11-11 DIAGNOSIS — Z3A35 35 weeks gestation of pregnancy: Secondary | ICD-10-CM | POA: Diagnosis not present

## 2022-11-18 DIAGNOSIS — Z362 Encounter for other antenatal screening follow-up: Secondary | ICD-10-CM | POA: Diagnosis not present

## 2022-11-18 LAB — OB RESULTS CONSOLE GBS: GBS: NEGATIVE

## 2022-12-04 ENCOUNTER — Inpatient Hospital Stay (HOSPITAL_COMMUNITY)
Admission: AD | Admit: 2022-12-04 | Discharge: 2022-12-06 | DRG: 807 | Disposition: A | Discharge status: Home / Self Care | Payer: BC Managed Care – PPO | Attending: Obstetrics and Gynecology | Admitting: Obstetrics and Gynecology

## 2022-12-04 DIAGNOSIS — Z8249 Family history of ischemic heart disease and other diseases of the circulatory system: Secondary | ICD-10-CM

## 2022-12-04 DIAGNOSIS — O134 Gestational [pregnancy-induced] hypertension without significant proteinuria, complicating childbirth: Principal | ICD-10-CM | POA: Diagnosis present

## 2022-12-04 DIAGNOSIS — D259 Leiomyoma of uterus, unspecified: Secondary | ICD-10-CM | POA: Diagnosis present

## 2022-12-04 DIAGNOSIS — Z91040 Latex allergy status: Secondary | ICD-10-CM

## 2022-12-04 DIAGNOSIS — K589 Irritable bowel syndrome without diarrhea: Secondary | ICD-10-CM | POA: Diagnosis present

## 2022-12-04 DIAGNOSIS — F32A Depression, unspecified: Secondary | ICD-10-CM | POA: Diagnosis present

## 2022-12-04 DIAGNOSIS — O9962 Diseases of the digestive system complicating childbirth: Secondary | ICD-10-CM | POA: Diagnosis present

## 2022-12-04 DIAGNOSIS — Z3A38 38 weeks gestation of pregnancy: Secondary | ICD-10-CM

## 2022-12-04 DIAGNOSIS — O3413 Maternal care for benign tumor of corpus uteri, third trimester: Secondary | ICD-10-CM | POA: Diagnosis present

## 2022-12-05 ENCOUNTER — Encounter (HOSPITAL_COMMUNITY): Payer: Self-pay | Admitting: Obstetrics and Gynecology

## 2022-12-05 ENCOUNTER — Inpatient Hospital Stay (HOSPITAL_COMMUNITY): Payer: BC Managed Care – PPO

## 2022-12-05 DIAGNOSIS — D259 Leiomyoma of uterus, unspecified: Secondary | ICD-10-CM | POA: Diagnosis not present

## 2022-12-05 DIAGNOSIS — O9962 Diseases of the digestive system complicating childbirth: Secondary | ICD-10-CM | POA: Diagnosis not present

## 2022-12-05 DIAGNOSIS — K589 Irritable bowel syndrome without diarrhea: Secondary | ICD-10-CM | POA: Diagnosis not present

## 2022-12-05 DIAGNOSIS — F32A Depression, unspecified: Secondary | ICD-10-CM | POA: Diagnosis not present

## 2022-12-05 DIAGNOSIS — O134 Gestational [pregnancy-induced] hypertension without significant proteinuria, complicating childbirth: Secondary | ICD-10-CM | POA: Diagnosis not present

## 2022-12-05 DIAGNOSIS — Z3A38 38 weeks gestation of pregnancy: Secondary | ICD-10-CM | POA: Diagnosis not present

## 2022-12-05 DIAGNOSIS — Z91040 Latex allergy status: Secondary | ICD-10-CM | POA: Diagnosis not present

## 2022-12-05 DIAGNOSIS — O3413 Maternal care for benign tumor of corpus uteri, third trimester: Secondary | ICD-10-CM | POA: Diagnosis not present

## 2022-12-05 DIAGNOSIS — Z8249 Family history of ischemic heart disease and other diseases of the circulatory system: Secondary | ICD-10-CM | POA: Diagnosis not present

## 2022-12-05 DIAGNOSIS — O26893 Other specified pregnancy related conditions, third trimester: Secondary | ICD-10-CM | POA: Diagnosis not present

## 2022-12-05 LAB — COMPREHENSIVE METABOLIC PANEL
ALT: 12 U/L (ref 0–44)
AST: 27 U/L (ref 15–41)
Albumin: 2.8 g/dL — ABNORMAL LOW (ref 3.5–5.0)
Alkaline Phosphatase: 160 U/L — ABNORMAL HIGH (ref 38–126)
Anion gap: 10 (ref 5–15)
BUN: 7 mg/dL (ref 6–20)
CO2: 23 mmol/L (ref 22–32)
Calcium: 8.9 mg/dL (ref 8.9–10.3)
Chloride: 104 mmol/L (ref 98–111)
Creatinine, Ser: 0.7 mg/dL (ref 0.44–1.00)
GFR, Estimated: >60 mL/min (ref >60)
Glucose, Bld: 89 mg/dL (ref 70–99)
Potassium: 3.9 mmol/L (ref 3.5–5.1)
Sodium: 137 mmol/L (ref 135–145)
Total Bilirubin: 0.7 mg/dL (ref 0.3–1.2)
Total Protein: 6.3 g/dL — ABNORMAL LOW (ref 6.5–8.1)

## 2022-12-05 LAB — CBC
HCT: 35.6 % — ABNORMAL LOW (ref 36.0–46.0)
Hemoglobin: 12.2 g/dL (ref 12.0–15.0)
MCH: 26.5 pg (ref 26.0–34.0)
MCHC: 34.3 g/dL (ref 30.0–36.0)
MCV: 77.4 fL — ABNORMAL LOW (ref 80.0–100.0)
Platelets: 188 10*3/uL (ref 150–400)
RBC: 4.6 MIL/uL (ref 3.87–5.11)
RDW: 16.3 % — ABNORMAL HIGH (ref 11.5–15.5)
WBC: 7 10*3/uL (ref 4.0–10.5)
nRBC: 0 % (ref 0.0–0.2)

## 2022-12-05 LAB — PROTEIN / CREATININE RATIO, URINE
Creatinine, Urine: 70 mg/dL
Protein Creatinine Ratio: 0.14 mg/mg{creat} (ref 0.00–0.15)
Total Protein, Urine: 10 mg/dL

## 2022-12-05 LAB — RPR: RPR Ser Ql: NONREACTIVE

## 2022-12-05 MED ORDER — ONDANSETRON HCL 4 MG PO TABS: 4.0000 mg | ORAL_TABLET | ORAL | Status: DC | PRN

## 2022-12-05 MED ORDER — PHENYLEPHRINE 80 MCG/ML (10ML) SYRINGE FOR IV PUSH (FOR BLOOD PRESSURE SUPPORT): 80.0000 ug | PREFILLED_SYRINGE | INTRAVENOUS | Status: DC | PRN

## 2022-12-05 MED ORDER — LACTATED RINGERS IV SOLN: INTRAVENOUS | Status: DC

## 2022-12-05 MED ORDER — LACTATED RINGERS IV SOLN: 500.0000 mL | Freq: Once | INTRAVENOUS | Status: DC

## 2022-12-05 MED ORDER — IBUPROFEN 600 MG PO TABS
600.0000 mg | ORAL_TABLET | Freq: Four times a day (QID) | ORAL | Status: DC
  Administered 2022-12-05 – 2022-12-06 (×4): 600 mg via ORAL
  Filled 2022-12-05 (×4): qty 1

## 2022-12-05 MED ORDER — OXYCODONE-ACETAMINOPHEN 5-325 MG PO TABS: 2.0000 | ORAL_TABLET | ORAL | Status: DC | PRN

## 2022-12-05 MED ORDER — OXYTOCIN BOLUS FROM INFUSION
333.0000 mL | Freq: Once | INTRAVENOUS | Status: AC
  Administered 2022-12-05: 333 mL via INTRAVENOUS

## 2022-12-05 MED ORDER — LACTATED RINGERS IV SOLN: 500.0000 mL | INTRAVENOUS | Status: DC | PRN

## 2022-12-05 MED ORDER — OXYTOCIN-SODIUM CHLORIDE 30-0.9 UT/500ML-% IV SOLN
1.0000 m[IU]/min | INTRAVENOUS | Status: DC
  Administered 2022-12-05: 2 m[IU]/min via INTRAVENOUS

## 2022-12-05 MED ORDER — SOD CITRATE-CITRIC ACID 500-334 MG/5ML PO SOLN: 30.0000 mL | ORAL | Status: DC | PRN

## 2022-12-05 MED ORDER — COCONUT OIL OIL: 1.0000 | TOPICAL_OIL | Status: DC | PRN

## 2022-12-05 MED ORDER — TERBUTALINE SULFATE 1 MG/ML IJ SOLN: 0.2500 mg | Freq: Once | INTRAMUSCULAR | Status: DC | PRN

## 2022-12-05 MED ORDER — ACETAMINOPHEN 325 MG PO TABS: 650.0000 mg | ORAL_TABLET | ORAL | Status: DC | PRN

## 2022-12-05 MED ORDER — MISOPROSTOL 200 MCG PO TABS
800.0000 ug | ORAL_TABLET | Freq: Once | ORAL | Status: AC
  Administered 2022-12-05: 800 ug via RECTAL

## 2022-12-05 MED ORDER — BENZOCAINE-MENTHOL 20-0.5 % EX AERO: 1.0000 | INHALATION_SPRAY | CUTANEOUS | Status: DC | PRN

## 2022-12-05 MED ORDER — ONDANSETRON HCL 4 MG/2ML IJ SOLN
4.0000 mg | Freq: Four times a day (QID) | INTRAMUSCULAR | Status: DC | PRN
  Administered 2022-12-05: 4 mg via INTRAVENOUS
  Filled 2022-12-05: qty 2

## 2022-12-05 MED ORDER — DIPHENHYDRAMINE HCL 50 MG/ML IJ SOLN: 12.5000 mg | INTRAMUSCULAR | Status: DC | PRN

## 2022-12-05 MED ORDER — LIDOCAINE HCL (PF) 1 % IJ SOLN
INTRAMUSCULAR | Status: DC | PRN
  Administered 2022-12-05 (×2): 5 mL via EPIDURAL

## 2022-12-05 MED ORDER — TRANEXAMIC ACID-NACL 1000-0.7 MG/100ML-% IV SOLN
INTRAVENOUS | Status: AC
  Administered 2022-12-05: 1000 mg via INTRAVENOUS
  Filled 2022-12-05: qty 100

## 2022-12-05 MED ORDER — ONDANSETRON HCL 4 MG/2ML IJ SOLN: 4.0000 mg | INTRAMUSCULAR | Status: DC | PRN

## 2022-12-05 MED ORDER — HYDROXYZINE HCL 50 MG PO TABS: 50.0000 mg | ORAL_TABLET | Freq: Four times a day (QID) | ORAL | Status: DC | PRN

## 2022-12-05 MED ORDER — SIMETHICONE 80 MG PO CHEW: 80.0000 mg | CHEWABLE_TABLET | ORAL | Status: DC | PRN

## 2022-12-05 MED ORDER — EPHEDRINE 5 MG/ML INJ: 10.0000 mg | INTRAVENOUS | Status: DC | PRN

## 2022-12-05 MED ORDER — DIBUCAINE (PERIANAL) 1 % EX OINT: 1.0000 | TOPICAL_OINTMENT | CUTANEOUS | Status: DC | PRN

## 2022-12-05 MED ORDER — LIDOCAINE HCL (PF) 1 % IJ SOLN: 30.0000 mL | INTRAMUSCULAR | Status: DC | PRN

## 2022-12-05 MED ORDER — OXYTOCIN-SODIUM CHLORIDE 30-0.9 UT/500ML-% IV SOLN
2.5000 [IU]/h | INTRAVENOUS | Status: DC
  Filled 2022-12-05: qty 500

## 2022-12-05 MED ORDER — TETANUS-DIPHTH-ACELL PERTUSSIS 5-2.5-18.5 LF-MCG/0.5 IM SUSY: 0.5000 mL | PREFILLED_SYRINGE | Freq: Once | INTRAMUSCULAR | Status: DC

## 2022-12-05 MED ORDER — FENTANYL CITRATE (PF) 100 MCG/2ML IJ SOLN: 50.0000 ug | INTRAMUSCULAR | Status: DC | PRN

## 2022-12-05 MED ORDER — MISOPROSTOL 200 MCG PO TABS
ORAL_TABLET | ORAL | Status: AC
  Administered 2022-12-05: 800 ug via RECTAL
  Filled 2022-12-05: qty 4

## 2022-12-05 MED ORDER — PHENYLEPHRINE 80 MCG/ML (10ML) SYRINGE FOR IV PUSH (FOR BLOOD PRESSURE SUPPORT)
80.0000 ug | PREFILLED_SYRINGE | INTRAVENOUS | Status: DC | PRN
  Filled 2022-12-05: qty 10

## 2022-12-05 MED ORDER — BUPIVACAINE HCL (PF) 0.25 % IJ SOLN
INTRAMUSCULAR | Status: DC | PRN
  Administered 2022-12-05 (×2): 10 mL via EPIDURAL

## 2022-12-05 MED ORDER — TRANEXAMIC ACID-NACL 1000-0.7 MG/100ML-% IV SOLN: 1000.0000 mg | INTRAVENOUS | Status: AC

## 2022-12-05 MED ORDER — PRENATAL MULTIVITAMIN CH
1.0000 | ORAL_TABLET | Freq: Every day | ORAL | Status: DC
  Administered 2022-12-06: 1 via ORAL
  Filled 2022-12-05: qty 1

## 2022-12-05 MED ORDER — SENNOSIDES-DOCUSATE SODIUM 8.6-50 MG PO TABS
2.0000 | ORAL_TABLET | Freq: Every day | ORAL | Status: DC
  Administered 2022-12-06: 2 via ORAL
  Filled 2022-12-05: qty 2

## 2022-12-05 MED ORDER — OXYCODONE-ACETAMINOPHEN 5-325 MG PO TABS: 1.0000 | ORAL_TABLET | ORAL | Status: DC | PRN

## 2022-12-05 MED ORDER — FENTANYL-BUPIVACAINE-NACL 0.5-0.125-0.9 MG/250ML-% EP SOLN
12.0000 mL/h | EPIDURAL | Status: DC | PRN
  Administered 2022-12-05: 12 mL/h via EPIDURAL
  Filled 2022-12-05: qty 250

## 2022-12-05 MED ORDER — DIPHENHYDRAMINE HCL 25 MG PO CAPS: 25.0000 mg | ORAL_CAPSULE | Freq: Four times a day (QID) | ORAL | Status: DC | PRN

## 2022-12-05 MED ORDER — WITCH HAZEL-GLYCERIN EX PADS: 1.0000 | MEDICATED_PAD | CUTANEOUS | Status: DC | PRN

## 2022-12-05 MED ORDER — ZOLPIDEM TARTRATE 5 MG PO TABS: 5.0000 mg | ORAL_TABLET | Freq: Every evening | ORAL | Status: DC | PRN

## 2022-12-05 NOTE — Lactation Note (Signed)
This note was copied from a baby's chart. Lactation Consultation Note Mom stated BF going good. That the baby has BF good several times now. Baby sleeping on mom. Mom encouraged to feed baby 8-12 times/24 hours and with feeding cues.  Discussed when mature milk comes in. Encouraged mom to call for assistance w/feeding.  Mom would like Lactation to come back tomorrow.  Patient Name: Morgan Sanchez [id number] Date: 12/05/2022 Age:33 hours Reason for consult: Initial assessment;Early term 37-38.6wks   Maternal Data Does the patient have breastfeeding experience prior to this delivery?: Yes  Feeding    LATCH Score                    Lactation Tools Discussed/Used    Interventions Interventions: [facility] brochure  Discharge    Consult Status Consult Status: Follow-up Date: 12/06/22 Follow-up type: In-patient    Charyl Dancer 12/05/2022, 10:36 PM

## 2022-12-05 NOTE — Progress Notes (Signed)
Labor Progress Note Morgan Sanchez is a 32 y.o. G2P1001 at [redacted]w[redacted]d presented for SOL.   S: Comfortable with epidural  O:  BP 119/68   Pulse 99   Temp 98.4 F (36.9 C) (Oral)   Resp 16   Ht 5\' 1"  (1.549 m)   Wt 88.1 kg   LMP 03/10/2022   SpO2 100%   BMI 36.71 kg/m  EFM: 140bpm/moderate/+accels, no decels Toco: 1-2 mins  CVE: Dilation: 6.5 Effacement (%): 80 Station: -2, -1 Presentation: Vertex Exam by:: Dr. Salvadore Dom   A&P: 32 y.o. G2P1001 [redacted]w[redacted]d here for SOL.  #Labor: Progressing well. S/p AROM clear fluid. Continue expectant management.  #Pain: Epidural #FWB: Cat I  #GBS negative  Hx of severe preE, HELLP (Taking ASA) Elevated BPs on admission. Wnl now. PreE labs wnl.  -Continue to monitor BPs   Hx of PPH -TXA @ del   IBS -Consider avoiding Hemabate   Anxiety/Depression No meds.  -Follow up PP    [name], DO 7:30 AM

## 2022-12-05 NOTE — Progress Notes (Signed)
Labor Progress Note Morgan Sanchez is a 32 y.o. G2P1001 at [redacted]w[redacted]d presented for SOL.   S: Comfortable with epidural  O:  BP 108/60   Pulse 88   Temp 98.6 F (37 C) (Oral)   Resp 18   Ht 5\' 1"  (1.549 m)   Wt 88.1 kg   LMP 03/10/2022   SpO2 100%   BMI 36.71 kg/m  EFM: 140bpm/moderate/+accels, occasional variable Toco: 3-4 mins  CVE: Dilation: 6.5 Effacement (%): 80 Cervical Position: Middle Station: -2 Presentation: Vertex Exam by:: Dr. Salvadore Dom   A&P: 32 y.o. G2P1001 [redacted]w[redacted]d here for SOL.  #Labor: Unchanged and contraction intervals are increasing. Discussed starting pitocin. The patient declined at this time and would like to continue natural labor in hopes her cervix will continue to change. Plan to recheck at noon. If unchanged start pitocin at that time.  #Pain: Epidural #FWB: Cat I, with periods of Cat II but overall reassuring #GBS negative  Hx of severe preE, HELLP (Taking ASA) Elevated BPs on admission. Wnl now. PreE labs wnl.  -Continue to monitor BPs   Hx of PPH -TXA @ del   IBS -Consider avoiding Hemabate   Anxiety/Depression No meds.  -Follow up PP    Matthe Sloane Autry-Lott, DO 12:08 PM

## 2022-12-05 NOTE — Anesthesia Procedure Notes (Signed)
Epidural Patient location during procedure: OB Start time: 12/05/2022 3:00 AM End time: 12/05/2022 3:06 AM  Staffing Anesthesiologist:  N[place], MD Performed: anesthesiologist   Preanesthetic Checklist Completed: patient identified, IV checked, risks and benefits discussed, monitors and equipment checked, pre-op evaluation and timeout performed  Epidural Patient position: sitting Prep: DuraPrep Patient monitoring: heart rate, cardiac monitor, continuous pulse ox and blood pressure Approach: midline Location: L3-L4 Injection technique: LOR air  Needle:  Needle type: Tuohy  Needle gauge: 17 G Needle length: 9 cm Needle insertion depth: 6 cm Catheter type: closed end flexible Catheter size: 19 Gauge Catheter at skin depth: 11 cm Test dose: negative  Assessment Sensory level: T8  Additional Notes Patient identified. Risks/Benefits/Options discussed with patient including but not limited to bleeding, infection, nerve damage, paralysis, failed block, incomplete pain control, headache, blood pressure changes, nausea, vomiting, reactions to medication both or allergic, itching and postpartum back pain. Confirmed with bedside nurse the patient's most recent platelet count. Confirmed with patient that they are not currently taking any anticoagulation, have any bleeding history or any family history of bleeding disorders. Patient expressed understanding and wished to proceed. All questions were answered. Sterile technique was used throughout the entire procedure. Please see nursing notes for vital signs. Test dose was given through epidural catheter and negative prior to continuing to dose epidural or start infusion. Warning signs of high block given to the patient including shortness of breath, tingling/numbness in hands, complete motor block, or any concerning symptoms with instructions to call for help. Patient was given instructions on fall risk and not to get out of bed. All questions and  concerns addressed with instructions to call with any issues or inadequate analgesia.  Reason for block:procedure for pain

## 2022-12-05 NOTE — Progress Notes (Signed)
Labor Progress Note Morgan Sanchez is a 32 y.o. G2P1001 at [redacted]w[redacted]d presented for SOL.   S: No acute concerns.   O:  BP 108/60   Pulse 88   Temp 98.6 F (37 C) (Oral)   Resp 18   Ht 5\' 1"  (1.549 m)   Wt 88.1 kg   LMP 03/10/2022   SpO2 100%   BMI 36.71 kg/m  EFM: 130bpm/moderate/+accels, no decels Toco: 4-5 mins  CVE: Dilation: 6.5 Effacement (%): 80 Cervical Position: Middle Station: -2 Presentation: Vertex Exam by:: Dr. Salvadore Dom   A&P: 32 y.o. G2P1001 [redacted]w[redacted]d here for SOL.  #Labor: Unchanged and contraction interval continues to increase. IUPC placed. Start pitocin 2 by 2.  #Pain: Epidural #FWB: Cat I #GBS negative  Hx of severe preE, HELLP (Taking ASA) Elevated BPs on admission. Wnl now. PreE labs wnl.  -Continue to monitor BPs   Hx of PPH -TXA @ del   IBS -Consider avoiding Hemabate   Anxiety/Depression No meds.  -Follow up PP    Jacqulene Huntley Autry-Lott, DO 12:11 PM

## 2022-12-05 NOTE — Anesthesia Preprocedure Evaluation (Signed)
Anesthesia Evaluation  Patient identified by MRN, date of birth, ID band Patient awake    Reviewed: Allergy & Precautions, H&P , NPO status , Patient's Chart, lab work & pertinent test results  Airway Mallampati: II  TM Distance: >3 FB Neck ROM: Full    Dental no notable dental hx.    Pulmonary neg pulmonary ROS   Pulmonary exam normal breath sounds clear to auscultation       Cardiovascular Normal cardiovascular exam Rhythm:Regular Rate:Normal  Hx of severe pre-E HELLP syndrome in prior pregnancy   Neuro/Psych  PSYCHIATRIC DISORDERS Anxiety Depression    negative neurological ROS     GI/Hepatic Neg liver ROS,GERD  ,,  Endo/Other  negative endocrine ROS    Renal/GU negative Renal ROS  negative genitourinary   Musculoskeletal negative musculoskeletal ROS (+)    Abdominal   Peds negative pediatric ROS (+)  Hematology  (+) Blood dyscrasia, anemia   Anesthesia Other Findings Hx of severe pre-E HELLP syndrome in prior pregnancy Hx PPH  Reproductive/Obstetrics negative OB ROS                             Anesthesia Physical Anesthesia Plan  ASA: 2  Anesthesia Plan: Epidural   Post-op Pain Management:    Induction:   PONV Risk Score and Plan: Treatment may vary due to age or medical condition  Airway Management Planned: Natural Airway  Additional Equipment:   Intra-op Plan:   Post-operative Plan:   Informed Consent: I have reviewed the patients History and Physical, chart, labs and discussed the procedure including the risks, benefits and alternatives for the proposed anesthesia with the patient or authorized representative who has indicated his/her understanding and acceptance.       Plan Discussed with: Anesthesiologist  Anesthesia Plan Comments: (Patient identified. Risks, benefits, options discussed with patient including but not limited to bleeding, infection, nerve  damage, paralysis, failed block, incomplete pain control, headache, blood pressure changes, nausea, vomiting, reactions to medication, itching, and post partum back pain. Confirmed with bedside nurse the patient's most recent platelet count. Confirmed with the patient that they are not taking any anticoagulation, have any bleeding history or any family history of bleeding disorders. Patient expressed understanding and wishes to proceed. All questions were answered. )       Anesthesia Quick Evaluation

## 2022-12-05 NOTE — H&P (Addendum)
OBSTETRIC ADMISSION HISTORY AND PHYSICAL  Morgan Sanchez is a 32 y.o. female G2P1001 with IUP at [redacted]w[redacted]d by early [place] presenting for SOL. She reports +FMs, No LOF, no VB, no blurry vision, headaches or peripheral edema, and RUQ pain.  She plans on breast feeding. She request nothing for birth control. She received her prenatal care at  [facility] starting @9wks     Dating: By 9 wk [place] --->  Estimated Date of Delivery: 12/15/22  Sono:    @[redacted]w[redacted]d , CWD, normal anatomy, cephalic presentation, posterior placental lie, 469g, 37% EFW   Prenatal History/Complications: -Initiated care @9wk  - Hx of severe preE, HELLP (Taking ASA) -Hx of PPH -Hgb C trait -Fibroids -Anxiety/Depression -IBS  Past Medical History: Past Medical History:  Diagnosis Date   Anxiety    Depression    GERD (gastroesophageal reflux disease)     Past Surgical History: Past Surgical History:  Procedure Laterality Date   KNEE SURGERY     WISDOM TOOTH EXTRACTION      Obstetrical History: OB History     Gravida  2   Para  1   Term  1   Preterm      AB      Living  1      SAB      IAB      Ectopic      Multiple  0   Live Births  1           Social History Social History   Socioeconomic History   Marital status: Married    Spouse name: Not on file   Number of children: Not on file   Years of education: Not on file   Highest education level: Not on file  Occupational History   Not on file  Tobacco Use   Smoking status: Never   Smokeless tobacco: Never  Vaping Use   Vaping status: Former  Substance and Sexual Activity   Alcohol use: Not Currently   Drug use: No   Sexual activity: Not Currently  Other Topics Concern   Not on file  Social History Narrative   Not on file   Social Determinants of Health   Financial Resource Strain: Not on file  Food Insecurity: Not on file  Transportation Needs: Not on file  Physical Activity: Not on file  Stress: Not on file  Social  Connections: Unknown (08/10/2021)   Received from [facility]   Social Network    Social Network: Not on file    Family History: Family History  Problem Relation Age of Onset   Healthy Mother    Hypertension Father    Healthy Father    Asthma Neg Hx    Diabetes Neg Hx    Stroke Neg Hx     Allergies: Allergies  Allergen Reactions   Cherry Anaphylaxis   Latex Rash    No medications prior to admission.     Review of Systems   All systems reviewed and negative except as stated in HPI  Blood pressure 129/80, pulse 97, temperature 98.9 F (37.2 C), temperature source Oral, resp. rate 19, height 5\' 1"  (1.549 m), weight 88.1 kg, last menstrual period 03/10/2022, SpO2 100%, unknown if currently breastfeeding. General appearance: alert Lungs: normal effort Heart: regular rate noted Abdomen: gravid for GA Extremities: No LE edema Presentation: cephalic per RN exam Fetal monitoringBaseline: 140 bpm, Variability: Good {> 6 bpm), Accelerations: Reactive, and Decelerations: Absent Uterine activity every 3 mins Dilation: 5.5 Effacement (%):  80 Station: -2 Exam by:: [employer] RN  Prenatal labs: ABO, Rh:  O+ Antibody:  Neg Rubella:  Immune RPR:   NR x2 HBsAg:   Neg HIV:   NR x2 GBS: Negative/-- (08/22 0000)  1 hr Glucola 129 Genetic screening  declined Anatomy US wnl  Prenatal Transfer Tool  Maternal Diabetes: No Genetic Screening: Declined Maternal Ultrasounds/Referrals: Normal Fetal Ultrasounds or other Referrals:  None Maternal Substance Abuse:  No Significant Maternal Medications:  None Significant Maternal Lab Results:  Group B Strep negative Number of Prenatal Visits:greater than 3 verified prenatal visits Other Comments:  None  Results for orders placed or performed during the hospital encounter of 12/04/22 (from the past 24 hour(s))  Protein / creatinine ratio, urine   Collection Time: 12/05/22 12:37 AM  Result Value Ref Range    Creatinine, Urine 70 mg/dL   Total Protein, Urine 10 mg/dL   Protein Creatinine Ratio 0.14 0.00 - 0.15 mg/mg[Cre]  CBC   Collection Time: 12/05/22 12:58 AM  Result Value Ref Range   WBC 7.0 4.0 - 10.5 K/uL   RBC 4.60 3.87 - 5.11 MIL/uL   Hemoglobin 12.2 12.0 - 15.0 g/dL   HCT 56.2 (L) 13.0 - 86.5 %   MCV 77.4 (L) 80.0 - 100.0 fL   MCH 26.5 26.0 - 34.0 pg   MCHC 34.3 30.0 - 36.0 g/dL   RDW 78.4 (H) 69.6 - 29.5 %   Platelets 188 150 - 400 K/uL   nRBC 0.0 0.0 - 0.2 %  Comprehensive metabolic panel   Collection Time: 12/05/22 12:58 AM  Result Value Ref Range   Sodium 137 135 - 145 mmol/L   Potassium 3.9 3.5 - 5.1 mmol/L   Chloride 104 98 - 111 mmol/L   CO2 23 22 - 32 mmol/L   Glucose, Bld 89 70 - 99 mg/dL   BUN 7 6 - 20 mg/dL   Creatinine, Ser 2.84 0.44 - 1.00 mg/dL   Calcium 8.9 8.9 - 13.2 mg/dL   Total Protein 6.3 (L) 6.5 - 8.1 g/dL   Albumin 2.8 (L) 3.5 - 5.0 g/dL   AST 27 15 - 41 U/L   ALT 12 0 - 44 U/L   Alkaline Phosphatase 160 (H) 38 - 126 U/L   Total Bilirubin 0.7 0.3 - 1.2 mg/dL   GFR, Estimated >44 >01 mL/min   Anion gap 10 5 - 15    Patient Active Problem List   Diagnosis Date Noted   IDA (iron deficiency anemia) 08/19/2021   PPH (postpartum hemorrhage) 08/19/2021   HELLP syndrome, delivered, current hospitalization 08/19/2021   Severe pre-eclampsia, with delivery 08/17/2021   Post-dates pregnancy 08/14/2021    Assessment/Plan:  Morgan Sanchez is a 32 y.o. G2P1001 at [redacted]w[redacted]d here for SOL.   #Labor: SOL. Unchanged since floor admission. Open to AROM once comfortable with her epidural.  #Pain: Epidural #FWB: Cat I  #ID: GBS neg #MOF: Breast #MOC: Declined #Circ: Unsure, briefly discussed will revisit following delivery  Hx of severe preE, HELLP (Taking ASA) Elevated BPs on admission which improved following epidural. PreE labs wnl.  -Continue to monitor BPs  Hx of PPH -TXA @ del  IBS -Consider avoiding Hemabate  Anxiety/Depression No meds.   -Follow up PP   Morgan Mccullum Autry-Lott, DO  12/05/2022, 1:54 AM

## 2022-12-05 NOTE — MAU Note (Signed)
.  Morgan Sanchez is a 32 y.o. at [redacted]w[redacted]d here in MAU reporting: was told by OB to come in due to patient being 3 cm in office 12/03/2022. Patient reports crampy contractions every 10 minutes. Patient reports lost mucous plug since SVE yesterday and feels more crampy contractions since then as well.   Denies VB, LOF and reports +FM   Onset of complaint: Today  Pain score: 3/10 Vitals:   12/05/22 0018  BP: (!) 149/78  Pulse: 94  Resp: 19  Temp: 98.9 F (37.2 C)  SpO2: 100%     FHT:150 Lab orders placed from triage:  mau labor

## 2022-12-06 ENCOUNTER — Other Ambulatory Visit: Payer: Self-pay

## 2022-12-06 MED ORDER — BENZOCAINE-MENTHOL 20-0.5 % EX AERO: 1.0000 | INHALATION_SPRAY | CUTANEOUS | 1 refills | Status: DC | PRN

## 2022-12-06 NOTE — Progress Notes (Signed)
MOB was referred for history of depression/anxiety.  * Referral screened out by Clinical Social Worker because none of the following criteria appear to apply:  ~ History of anxiety/depression during this pregnancy, or of post-partum depression following prior delivery.  ~ Diagnosis of anxiety and/or depression within last 3 years  Per OB notes, MOB did not indicate any signs/symptoms during pregnancy.  OR  * MOB's symptoms currently being treated with medication and/or therapy.  Please contact the Clinical Social Worker if needs arise, by MOB request, or if MOB scores greater than 9/yes to question 10 on Edinburgh Postpartum Depression Screen.   Ashley Jones, LCSWA Clinical Social Worker [phone]  

## 2022-12-06 NOTE — Discharge Summary (Signed)
Postpartum Discharge Summary  Date of Service updated9/9/24     Patient Name: Morgan Sanchez DOB: April 15, 1990 MRN: [id number]  Date of admission: 12/04/2022 Delivery date:12/05/2022 Delivering provider: Lavonda Jumbo Date of discharge: 12/06/2022  Admitting diagnosis: Normal labor [O80, Z37.9] Intrauterine pregnancy: [redacted]w[redacted]d     Secondary diagnosis:  Principal Problem:   Normal labor  Additional problems: GHTN     Discharge diagnosis: Term Pregnancy Delivered and Gestational Hypertension                                              Post partum procedures: na Augmentation: AROM Complications: None  Hospital course: Onset of Labor With Vaginal Delivery      32 y.o. yo [id number] at [redacted]w[redacted]d was admitted in Active Labor on 12/04/2022. Labor course was complicated bynothing  Membrane Rupture Time/Date: 6:32 AM,12/05/2022  Delivery Method:Vaginal, Spontaneous Operative Delivery:N/A Episiotomy: None Lacerations:  2nd degree Patient had a postpartum course complicated by nothing.  She is ambulating, tolerating a regular diet, passing flatus, and urinating well. Patient is discharged home in stable condition on 12/06/22.  Newborn Data: Birth date:12/05/2022 Birth time:4:51 PM Gender:Female Living status:Living Apgars:9 ,9  Weight:3280 g  Magnesium Sulfate received: No BMZ received: No Rhophylac:No MMR:No T-DaP:Given postpartum Flu: No Transfusion:No  Physical exam  Vitals:   12/05/22 2015 12/05/22 2310 12/06/22 0420 12/06/22 1249  BP: 122/80 111/65 103/71 106/64  Pulse: 92 83 70 65  Resp: 16 16 16 17   Temp: 98.7 F (37.1 C) 98.3 F (36.8 C) 98.2 F (36.8 C) 97.7 F (36.5 C)  TempSrc: Oral Oral Oral Oral  SpO2: 100% 100% 100% 100%  Weight:      Height:       General: alert and cooperative Lochia: appropriate Uterine Fundus: firm Incision: Healing well with no significant drainage DVT Evaluation: No evidence of DVT seen on physical exam. Labs: Lab Results  Component  Value Date   WBC 7.0 12/05/2022   HGB 12.2 12/05/2022   HCT 35.6 (L) 12/05/2022   MCV 77.4 (L) 12/05/2022   PLT 188 12/05/2022      Latest Ref Rng & Units 12/05/2022   12:58 AM  CMP  Glucose 70 - 99 mg/dL 89   BUN 6 - 20 mg/dL 7   Creatinine 1.47 - 8.29 mg/dL 5.62   Sodium 130 - 865 mmol/L 137   Potassium 3.5 - 5.1 mmol/L 3.9   Chloride 98 - 111 mmol/L 104   CO2 22 - 32 mmol/L 23   Calcium 8.9 - 10.3 mg/dL 8.9   Total Protein 6.5 - 8.1 g/dL 6.3   Total Bilirubin 0.3 - 1.2 mg/dL 0.7   Alkaline Phos 38 - 126 U/L 160   AST 15 - 41 U/L 27   ALT 0 - 44 U/L 12    Edinburgh Score:    12/06/2022    9:15 AM  Edinburgh Postnatal Depression Scale Screening Tool  I have been able to laugh and see the funny side of things. 0  I have looked forward with enjoyment to things. 0  I have blamed myself unnecessarily when things went wrong. 1  I have been anxious or worried for no good reason. 0  I have felt scared or panicky for no good reason. 0  Things have been getting on top of me. 0  I have been so unhappy that  I have had difficulty sleeping. 0  I have felt sad or miserable. 0  I have been so unhappy that I have been crying. 0  The thought of harming myself has occurred to me. 0  Edinburgh Postnatal Depression Scale Total 1      After visit meds:  Allergies as of 12/06/2022       Reactions   Cherry Anaphylaxis   Latex Rash        Medication List     STOP taking these medications    acetaminophen 325 MG tablet Commonly known as: Tylenol   aspirin EC 81 MG tablet   Iron 100/C 100-250 MG Tabs Generic drug: Iron-Vitamin C   NIFEdipine 90 MG 24 hr tablet Commonly known as: ADALAT CC   Senexon-S 8.6-50 MG tablet Generic drug: senna-docusate       TAKE these medications    benzocaine-Menthol 20-0.5 % Aero Commonly known as: DERMOPLAST Apply 1 Application topically as needed for irritation (perineal discomfort).   dibucaine 1 % Oint Commonly known as:  NUPERCAINAL Place 1 application. rectally as needed for hemorrhoids or anal irritation.   ferrous sulfate 325 (65 FE) MG EC tablet Take 325 mg by mouth 3 (three) times daily with meals.   hydrocortisone 25 MG suppository Commonly known as: ANUSOL-HC Place 1 suppository (25 mg total) rectally 2 (two) times daily.   ibuprofen 600 MG tablet Commonly known as: ADVIL Take 1 tablet (600 mg total) by mouth every 6 (six) hours.   multivitamin-prenatal 27-0.8 MG Tabs tablet Take 1 tablet by mouth daily at 12 noon.         Discharge home in stable condition Infant Feeding: Bottle and Breast Infant Disposition:home with mother Discharge instruction: per After Visit Summary and Postpartum booklet. Activity: Advance as tolerated. Pelvic rest for 6 weeks.  Diet: routine diet Anticipated Birth Control: Unsure Postpartum Appointment:1 week Additional Postpartum F/U: BP check 1 week Future Appointments:No future appointments. Follow up Visit:      12/06/2022 Michael Litter, MD

## 2022-12-07 NOTE — Anesthesia Postprocedure Evaluation (Signed)
Anesthesia Post Note  Patient: Morgan Sanchez  Procedure(s) Performed: AN AD HOC LABOR EPIDURAL     Patient location during evaluation: Mother Baby Anesthesia Type: Epidural Level of consciousness: awake and alert and oriented Pain management: satisfactory to patient Vital Signs Assessment: post-procedure vital signs reviewed and stable Respiratory status: respiratory function stable Cardiovascular status: stable Postop Assessment: no headache, no backache, epidural receding, patient able to bend at knees, no signs of nausea or vomiting, adequate PO intake and able to ambulate Anesthetic complications: no   No notable events documented.  Last Vitals:  Vitals:   12/06/22 0420 12/06/22 1249  BP: 103/71 106/64  Pulse: 70 65  Resp: 16 17  Temp: 36.8 C 36.5 C  SpO2: 100% 100%    Last Pain:  Vitals:   12/06/22 1654  TempSrc:   PainSc: 0-No pain   Pain Goal:                   Morgan Sanchez

## 2022-12-13 DIAGNOSIS — O169 Unspecified maternal hypertension, unspecified trimester: Secondary | ICD-10-CM | POA: Diagnosis not present

## 2022-12-15 ENCOUNTER — Inpatient Hospital Stay (HOSPITAL_COMMUNITY)
Admission: AD | Admit: 2022-12-15 | Payer: BC Managed Care – PPO | Source: Home / Self Care | Admitting: Obstetrics & Gynecology

## 2023-01-05 ENCOUNTER — Telehealth (HOSPITAL_COMMUNITY): Payer: Self-pay | Admitting: *Deleted

## 2023-01-05 NOTE — Telephone Encounter (Signed)
Attempted hospital discharge follow-up call. Left message for patient to return RN call with any questions or concerns. Deforest Hoyles, RN, 01/05/23, [phone]

## 2023-01-15 DIAGNOSIS — J329 Chronic sinusitis, unspecified: Secondary | ICD-10-CM | POA: Diagnosis not present

## 2023-01-15 DIAGNOSIS — H6691 Otitis media, unspecified, right ear: Secondary | ICD-10-CM | POA: Diagnosis not present

## 2023-02-03 ENCOUNTER — Encounter: Payer: Self-pay | Admitting: Internal Medicine

## 2023-02-03 ENCOUNTER — Ambulatory Visit: Payer: BC Managed Care – PPO | Admitting: Internal Medicine

## 2023-02-03 VITALS — BP 108/64 | HR 78 | Temp 98.6°F | Resp 12 | Ht 62.0 in | Wt 162.0 lb

## 2023-02-03 DIAGNOSIS — J329 Chronic sinusitis, unspecified: Secondary | ICD-10-CM

## 2023-02-03 DIAGNOSIS — L253 Unspecified contact dermatitis due to other chemical products: Secondary | ICD-10-CM

## 2023-02-03 DIAGNOSIS — J310 Chronic rhinitis: Secondary | ICD-10-CM

## 2023-02-03 DIAGNOSIS — K219 Gastro-esophageal reflux disease without esophagitis: Secondary | ICD-10-CM | POA: Diagnosis not present

## 2023-02-03 NOTE — Progress Notes (Signed)
NEW PATIENT Date of Service/Encounter:  02/03/23 Referring provider: Colin Ina, FNP Primary care provider: Patient, No Pcp Per  Subjective:  Morgan Sanchez is a 32 y.o. female presenting today for evaluation of recurrent sinusitis. History obtained from: chart review and patient.   Discussed the use of AI scribe software for clinical note transcription with the patient, who gave verbal consent to proceed.  History of Present Illness   The patient presents with recurrent sinus infection issues, which have been treated with amoxicillin and Flonase at urgent care every three months. She reports pressure in the head, lightheadedness, and fatigue. The patient also experiences congestion in one nostril, which seems to shift depending on her position, and ear discomfort. She denies any significant postnasal drainage, runny nose, sneezing attacks, or watery itchy eyes. However, she did experience an episode of what appeared to be pink eye due to excessive pressure. She is currently on amoxicillin, which was started on the previous Friday.  The patient's symptoms began after moving into a newly built house in 2021, around the same time she got a dog. She denies any significant health changes or new diagnoses in the past year. She has noticed black substance around her vents, suspecting a possible mold exposure. She has seen two ear, nose, and throat doctors, but no significant findings were reported. The patient has not had any imaging of her sinuses. She has been on antibiotics twice this year, once at the beginning of her recent pregnancy and currently. She has an 9 week old at home. She denies any history of hospitalization for infection. Denies history of pneumonia, asthma, eczema, food allergies, or medication allergies. She does report occasional acid reflux symptoms, which are controlled with diet. 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   She also reports  breaking out into a rash after using a cherry Chapstick.  Now avoids.     Chart Review:  Reviewed PCP notes from referral 01/18/23: R AOM, with recurrent sinusitis-plan: augmentin, flonase, refer to allergy  ENT visit 2022 Dr. Verne Spurr seen for ear pain and congestion, noted to have fungal elements in ear canal which were cleaned and examined portion of note.  She was started on Lotrimin and an ear culture was sent.   Past Medical History: Past Medical History:  Diagnosis Date   Anxiety    Depression    GERD (gastroesophageal reflux disease)    Medication List:  Current Outpatient Medications  Medication Sig Dispense Refill   fluticasone (FLONASE) 50 MCG/ACT nasal spray Place into both nostrils.     loratadine (CLARITIN) 10 MG tablet Take 10 mg by mouth daily.     No current facility-administered medications for this visit.   Known Allergies:  Allergies  Allergen Reactions   Cherry Flavor Itching and Swelling   Latex Rash   Past Surgical History: Past Surgical History:  Procedure Laterality Date   KNEE SURGERY     WISDOM TOOTH EXTRACTION     Family History: Family History  Problem Relation Age of Onset   Healthy Mother    Hypertension Father    Healthy Father    Asthma Neg Hx    Diabetes Neg Hx    Stroke Neg Hx    Social History: Roslynn lives in a house built 3 years ago, + water damage, luxury vinyl floors, electric heating, central AC, large indoor dog, no roaches, not using dust mite covers on the bed or the pillows, 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  Corliss Parish is 82 weeks old.  + HEPA filter in the home..   ROS:  All other systems negative except as noted per HPI.  Objective:  Blood pressure 108/64, pulse 78, temperature 98.6 F (37 C), temperature source Temporal, resp. rate 12, height 5\' 2"  (1.575 m), weight 162 lb (73.5 kg), last menstrual period 03/10/2022, SpO2 98%, unknown if currently breastfeeding. Body mass index is 29.63  kg/m. Physical Exam:  General Appearance:  Alert, cooperative, no distress, appears stated age  Head:  Normocephalic, without obvious abnormality, atraumatic  Eyes:  Conjunctiva clear, EOM's intact  Ears EACs normal bilaterally and normal TMs bilaterally  Nose: Nares normal, hypertrophic turbinates, normal mucosa, no visible anterior polyps, and septum midline  Throat: Lips, tongue normal; teeth and gums normal, normal posterior oropharynx  Neck: Supple, symmetrical  Lungs:   clear to auscultation bilaterally, Respirations unlabored, no coughing  Heart:  regular rate and rhythm and no murmur, Appears well perfused  Extremities: No edema  Skin: Skin color, texture, turgor normal and no rashes or lesions on visualized portions of skin  Neurologic: No gross deficits   Diagnostics:   Labs:  Lab Orders  No laboratory test(s) ordered today     Assessment and Plan  Assessment and Plan    Recurrent Sinusitis Frequent sinus infections requiring antibiotics, currently on Amoxicillin (2 rounds in last year). Nasal congestion, pressure in the head, lightheadedness, and fatigue reported. No significant findings on ENT examination per report. Possible environmental triggers (new house, dog, potential mold exposure). -Schedule allergy testing on 02/09/2023. -Start nasal saline rinses for symptom relief and to maintain nasal moisture. -Continue flonase 1-2 sprays in each nostril daily. -Continue current course of Amoxicillin. - if continues to require antibiotics, consider immune work-up  Gastroesophageal Reflux Disease (GERD) Reports symptoms with spicy food intake, controlled with diet. -Continue dietary management.  Contact dermatitis following cherry Chapstick use - Avoidance recommended  Follow-up Post-allergy testing to discuss results and potential treatment options.      Follow up : November 13th at 2 PM for allergy testing 1-55 It was a pleasure meeting you in clinic  today! Thank you for allowing me to participate in your care.  Tonny Bollman, MD [facility]   This note in its entirety was forwarded to the Provider who requested this consultation.  Other: samples provided of: nasal saline rinses  Thank you for your kind referral. I appreciate the opportunity to take part in [place] care. Please do not hesitate to contact me with questions.  Sincerely,  Tonny Bollman, MD [facility]

## 2023-02-03 NOTE — Patient Instructions (Addendum)
Recurrent Sinusitis Frequent sinus infections requiring antibiotics, currently on Amoxicillin (2 rounds in last year). Nasal congestion, pressure in the head, lightheadedness, and fatigue reported. No significant findings on ENT examination per report. Possible environmental triggers (new house, dog, potential mold exposure). -Schedule allergy testing on 02/09/2023. -Start nasal saline rinses for symptom relief and to maintain nasal moisture. -Continue flonase 1-2 sprays in each nostril daily. -Continue current course of Amoxicillin.  Gastroesophageal Reflux Disease (GERD) Reports symptoms with spicy food intake, controlled with diet. -Continue dietary management.  Contact dermatitis following cherry Chapstick use - Avoidance recommended  Follow-up November 13th at 2 pm for allergy testing, do not take any antihistamines for 3 days prior to visit. Post-allergy testing to discuss results and potential treatment options.   It was a pleasure meeting you in clinic today! Thank you for allowing me to participate in your care.  Tonny Bollman, MD [facility]

## 2023-02-07 NOTE — Progress Notes (Unsigned)
Date of Service/Encounter:  02/09/23  Allergy testing appointment   Initial visit on 02/03/23, seen for recurrent sinusitis, GERD, contact dermatitis.  Please see that note for additional details.  Today reports for allergy diagnostic testing:    DIAGNOSTICS:  Skin Testing: Environmental allergy panel. Adequate positive and negative controls. Results discussed with patient/family.  Airborne Adult Perc - 02/09/23 1421     Time Antigen Placed 1421    Allergen Manufacturer Waynette Buttery    Location Back    Number of Test 55    1. Control-Buffer 50% Glycerol Negative    2. Control-Histamine 3+    3. Bahia Negative    4. French Southern Territories Negative    5. Johnson Negative    6. Kentucky Blue Negative    7. Meadow Fescue Negative    8. Perennial Rye Negative    9. Timothy Negative    10. Ragweed Mix Negative    11. Cocklebur Negative    12. Plantain,  English Negative    13. Baccharis 2+    14. Dog Fennel 2+    15. Russian Thistle Negative    16. Lamb's Quarters Negative    17. Sheep Sorrell Negative    18. Rough Pigweed Negative    19. Marsh Elder, Rough Negative    20. Mugwort, Common Negative    21. Box, Elder Negative    22. Cedar, red Negative    23. Sweet Gum Negative    24. Pecan Pollen Negative    25. Pine Mix Negative    26. Walnut, Black Pollen Negative    27. Red Mulberry Negative    28. Ash Mix Negative    29. Birch Mix Negative    30. Beech American Negative    31. Cottonwood, Guinea-Bissau Negative    32. Hickory, White Negative    33. Maple Mix Negative    34. Oak, [place] Mix Negative    35. Sycamore Eastern Negative    36. Alternaria Alternata Negative    37. Cladosporium Herbarum 2+    38. Aspergillus Mix 2+    39. Penicillium Mix Negative    40. Bipolaris Sorokiniana (Helminthosporium) Negative    41. Drechslera Spicifera (Curvularia) Negative    42. Mucor Plumbeus Negative    43. Fusarium Moniliforme Negative    44. Aureobasidium Pullulans (pullulara) Negative     45. Rhizopus Oryzae 3+    46. Botrytis Cinera Negative    47. Epicoccum Nigrum Negative    48. Phoma Betae Negative    49. Dust Mite Mix Negative    50. Cat Hair 10,000 BAU/ml Negative    51.  Dog Epithelia Negative    52. Mixed Feathers 3+    53. Horse Epithelia Negative    54. Cockroach, German Negative    55. Tobacco Leaf Negative             Intradermal - 02/09/23 1457     Time Antigen Placed 1457    Allergen Manufacturer Other    Location Arm    Number of Test 12    Control 3+    Bahia Negative    French Southern Territories Negative    Johnson 3+    7 Grass Negative    Weed Mix Negative    Tree Mix Negative    Mold 3 Negative    Mite Mix 4+    Cat 3+    Dog 2+    Cockroach Negative             Allergy  testing results were read and interpreted by myself, documented by clinical staff.  Patient provided with copy of allergy testing along with avoidance measures when indicated.   Tonny Bollman, MD  [facility]

## 2023-02-09 ENCOUNTER — Encounter: Payer: Self-pay | Admitting: Internal Medicine

## 2023-02-09 ENCOUNTER — Ambulatory Visit: Payer: BC Managed Care – PPO | Admitting: Internal Medicine

## 2023-02-09 DIAGNOSIS — J302 Other seasonal allergic rhinitis: Secondary | ICD-10-CM | POA: Insufficient documentation

## 2023-02-09 DIAGNOSIS — J3089 Other allergic rhinitis: Secondary | ICD-10-CM

## 2023-02-09 DIAGNOSIS — H1013 Acute atopic conjunctivitis, bilateral: Secondary | ICD-10-CM

## 2023-02-09 MED ORDER — MONTELUKAST SODIUM 10 MG PO TABS: 10.0000 mg | ORAL_TABLET | Freq: Every day | ORAL | 5 refills | Status: DC

## 2023-02-09 NOTE — Patient Instructions (Signed)
Recurrent Sinusitis Frequent sinus infections requiring antibiotics, currently on Amoxicillin (2 rounds in last year). Nasal congestion, pressure in the head, lightheadedness, and fatigue reported. No significant findings on ENT examination per report. Possible environmental triggers (new house, dog, potential mold exposure). -Allergy testing today positive to grass pollen, weed pollen, indoor/outdoor molds, dust mites, cat, dog, mixed feathers -Start nasal saline rinses for symptom relief and to maintain nasal moisture. -Start nasacort, flonase sensimist, or nasonex 1-2 sprays in each nostril daily. (Avoid regular flonase due to headache history) -start a daily non-sedating anthistamine: Your options include: Zyrtec (cetirizine) 10 mg, Claritin (loratadine) 10 mg, Xyzal (levocetirizine) 5 mg or Allegra (fexofenadine) 180 mg daily as needed - start singulair 10 mg nightly-stop if nightmares or behavior changes - consider over the counter allergy eye drops-pataday and zaditor are great options - -Consider allergy injections to reduce lifetime symptoms and need for medications by teaching your immune system to become tolerant of the environmental allergens you are allergic to   Gastroesophageal Reflux Disease (GERD) Reports symptoms with spicy food intake, controlled with diet. -Continue dietary management.  Contact dermatitis following cherry Chapstick use - Avoidance recommended  Follow-up 3 months, sooner if needed   It was a pleasure seeing you again in clinic today! Thank you for allowing me to participate in your care.  Tonny Bollman, MD [facility]  Reducing Pollen Exposure  The American Academy of Allergy, Asthma and Immunology suggests the following steps to reduce your exposure to pollen during allergy seasons.    Do not hang sheets or clothing out to dry; pollen may collect on these items. Do not mow lawns or spend time around freshly cut grass; mowing stirs  up pollen. Keep windows closed at night.  Keep car windows closed while driving. Minimize morning activities outdoors, a time when pollen counts are usually at their highest. Stay indoors as much as possible when pollen counts or humidity is high and on windy days when pollen tends to remain in the air longer. Use air conditioning when possible.  Many air conditioners have filters that trap the pollen spores. Use a HEPA room air filter to remove pollen form the indoor air you breathe. Control of Mold Allergen   Mold and fungi can grow on a variety of surfaces provided certain temperature and moisture conditions exist.  Outdoor molds grow on plants, decaying vegetation and soil.  The major outdoor mold, Alternaria and Cladosporium, are found in very high numbers during hot and dry conditions.  Generally, a late Summer - Fall peak is seen for common outdoor fungal spores.  Rain will temporarily lower outdoor mold spore count, but counts rise rapidly when the rainy period ends.  The most important indoor molds are Aspergillus and Penicillium.  Dark, humid and poorly ventilated basements are ideal sites for mold growth.  The next most common sites of mold growth are the bathroom and the kitchen.  Outdoor (Seasonal) Mold Control  Use air conditioning and keep windows closed Avoid exposure to decaying vegetation. Avoid leaf raking. Avoid grain handling. Consider wearing a face mask if working in moldy areas.    Indoor (Perennial) Mold Control   Maintain humidity below 50%. Clean washable surfaces with 5% bleach solution. Remove sources e.g. contaminated carpets.   DUST MITE AVOIDANCE MEASURES:  There are three main measures that need and can be taken to avoid house dust mites:  Reduce accumulation of dust in general -reduce furniture, clothing, carpeting, books, stuffed animals, especially  in bedroom  Separate yourself from the dust -use pillow and mattress encasements (can be found at  stores such as [employer] or online) -avoid direct exposure to air condition flow -use a HEPA filter device, especially in the bedroom; you can also use a HEPA filter vacuum cleaner -wipe dust with a moist towel instead of a dry towel or broom when cleaning  Decrease mites and/or their secretions -wash clothing and linen and stuffed animals at highest temperature possible, at least every 2 weeks -stuffed animals can also be placed in a bag and put in a freezer overnight  Despite the above measures, it is impossible to eliminate dust mites or their allergen completely from your home.  With the above measures the burden of mites in your home can be diminished, with the goal of minimizing your allergic symptoms.  Success will be reached only when implementing and using all means together. Control of Dog or Cat Allergen  Avoidance is the best way to manage a dog or cat allergy. If you have a dog or cat and are allergic to dog or cats, consider removing the dog or cat from the home. If you have a dog or cat but don't want to find it a new home, or if your family wants a pet even though someone in the household is allergic, here are some strategies that may help keep symptoms at bay:  Keep the pet out of your bedroom and restrict it to only a few rooms. Be advised that keeping the dog or cat in only one room will not limit the allergens to that room. Don't pet, hug or kiss the dog or cat; if you do, wash your hands with soap and water. High-efficiency particulate air (HEPA) cleaners run continuously in a bedroom or living room can reduce allergen levels over time. Regular use of a high-efficiency vacuum cleaner or a central vacuum can reduce allergen levels. Giving your dog or cat a bath at least once a week can reduce airborne allergen.

## 2023-02-14 ENCOUNTER — Other Ambulatory Visit: Payer: Self-pay | Admitting: Internal Medicine

## 2023-02-14 DIAGNOSIS — J302 Other seasonal allergic rhinitis: Secondary | ICD-10-CM

## 2023-02-14 DIAGNOSIS — J3089 Other allergic rhinitis: Secondary | ICD-10-CM | POA: Diagnosis not present

## 2023-02-14 NOTE — Progress Notes (Signed)
Aeroallergen Immunotherapy  Ordering Provider: Dr. Tonny Bollman  Patient Details Name: Morgan Sanchez MRN: [id number] Date of Birth: Aug 02, 1990  Order 1 of 2  Vial Label: G/W/D  0.2 ml (Volume)  1:20 Concentration -- Johnson 0.2 ml (Volume)  1:40 Concentration -- Baccharis 0.2 ml (Volume)  1:80 Concentration -- Dogfennel 0.5 ml (Volume)  1:10 Concentration -- Dog Epithelia   1.1  ml Extract Subtotal 3.9  ml Diluent 5.0  ml Maintenance Total  Schedule:  B Blue Vial (1:100,000): Schedule B (6 doses) Yellow Vial (1:10,000): Schedule B (6 doses) Green Vial (1:1,000): Schedule B (6 doses) Red Vial (1:100): Schedule A (10 doses)  Special Instructions: Schedule B until red, then A

## 2023-02-14 NOTE — Progress Notes (Signed)
EXP 02/14/24

## 2023-02-14 NOTE — Progress Notes (Signed)
AIT Rx written. ?

## 2023-02-14 NOTE — Progress Notes (Signed)
  Aeroallergen Immunotherapy  Ordering Provider: Dr. Tonny Bollman  Patient Details Name: Inderpreet Ridens MRN: [id number] Date of Birth: 10/10/1990  Order 2 of 2  Vial Label: M/DM/C  0.2 ml (Volume)  1:20 Concentration -- Cladosporium herbarum 0.2 ml (Volume)  1:10 Concentration -- Aspergillus mix 0.2 ml (Volume)  1:10 Concentration -- Rhizopus oryzae 0.5 ml (Volume)  1:10 Concentration -- Cat Hair 0.5 ml (Volume)   AU Concentration -- Mite Mix (DF 5,000 & DP 5,000)   1.6  ml Extract Subtotal 3.4  ml Diluent 5.0  ml Maintenance Total  Schedule:  B Blue Vial (1:100,000): Schedule B (6 doses) Yellow Vial (1:10,000): Schedule B (6 doses) Green Vial (1:1,000): Schedule B (6 doses) Red Vial (1:100): Schedule A (10 doses)  Special Instructions: B until red then A

## 2023-02-15 DIAGNOSIS — J3081 Allergic rhinitis due to animal (cat) (dog) hair and dander: Secondary | ICD-10-CM | POA: Diagnosis not present

## 2023-03-04 ENCOUNTER — Ambulatory Visit (INDEPENDENT_AMBULATORY_CARE_PROVIDER_SITE_OTHER): Payer: BC Managed Care – PPO

## 2023-03-04 DIAGNOSIS — J309 Allergic rhinitis, unspecified: Secondary | ICD-10-CM | POA: Diagnosis not present

## 2023-03-04 MED ORDER — EPINEPHRINE 0.3 MG/0.3ML IJ SOAJ: 0.3000 mg | INTRAMUSCULAR | 1 refills | Status: DC | PRN

## 2023-03-04 NOTE — Progress Notes (Signed)
Immunotherapy   Patient Details  Name: Morgan Sanchez MRN: [id number] Date of Birth: 02/04/1991  03/04/2023  [name] started injections for Blue 1:100,000(M-DM-C and G-W-D) Following schedule: B  Frequency:1 time per week Epi-Pen:Epi-Pen Available  Consent signed and patient instructions given.   [name] [name] 03/04/2023, 3:16 PM

## 2023-03-07 ENCOUNTER — Telehealth: Payer: Self-pay

## 2023-03-07 MED ORDER — EPINEPHRINE 0.3 MG/0.3ML IJ SOAJ: 0.3000 mg | INTRAMUSCULAR | 1 refills | Status: DC | PRN

## 2023-03-07 NOTE — Telephone Encounter (Signed)
Prior Authorization sent in for Epi-pen. I sent in an Epi-pen with Mylan/Teva Brand only to [facility] in [place] [phone]

## 2023-03-07 NOTE — Telephone Encounter (Signed)
Pharmacy states INS will only pay for Auvi-Q, please resend prescription for Auvi-Q

## 2023-03-08 ENCOUNTER — Telehealth: Payer: Self-pay | Admitting: Internal Medicine

## 2023-03-08 ENCOUNTER — Other Ambulatory Visit (HOSPITAL_COMMUNITY): Payer: Self-pay

## 2023-03-08 MED ORDER — EPINEPHRINE 0.3 MG/0.3ML IJ SOAJ: 0.3000 mg | INTRAMUSCULAR | 1 refills | Status: AC | PRN

## 2023-03-08 NOTE — Telephone Encounter (Signed)
Sent in rx for epipen again without note on it

## 2023-03-08 NOTE — Telephone Encounter (Signed)
Pharmacy called stating there was a note stating not to fill this medication using Mylan/Teva Brand only they need this taken off the prescription notes to fill

## 2023-03-14 ENCOUNTER — Ambulatory Visit (INDEPENDENT_AMBULATORY_CARE_PROVIDER_SITE_OTHER): Payer: BC Managed Care – PPO

## 2023-03-14 DIAGNOSIS — J309 Allergic rhinitis, unspecified: Secondary | ICD-10-CM

## 2023-03-29 DIAGNOSIS — Z1329 Encounter for screening for other suspected endocrine disorder: Secondary | ICD-10-CM | POA: Diagnosis not present

## 2023-03-29 DIAGNOSIS — N393 Stress incontinence (female) (male): Secondary | ICD-10-CM | POA: Diagnosis not present

## 2023-03-29 DIAGNOSIS — Z131 Encounter for screening for diabetes mellitus: Secondary | ICD-10-CM | POA: Diagnosis not present

## 2023-03-29 DIAGNOSIS — Z01419 Encounter for gynecological examination (general) (routine) without abnormal findings: Secondary | ICD-10-CM | POA: Diagnosis not present

## 2023-03-29 DIAGNOSIS — Z13220 Encounter for screening for lipoid disorders: Secondary | ICD-10-CM | POA: Diagnosis not present

## 2023-04-01 ENCOUNTER — Ambulatory Visit (INDEPENDENT_AMBULATORY_CARE_PROVIDER_SITE_OTHER): Payer: Self-pay

## 2023-04-01 DIAGNOSIS — J309 Allergic rhinitis, unspecified: Secondary | ICD-10-CM

## 2023-04-08 ENCOUNTER — Ambulatory Visit (INDEPENDENT_AMBULATORY_CARE_PROVIDER_SITE_OTHER): Payer: BC Managed Care – PPO | Admitting: *Deleted

## 2023-04-08 DIAGNOSIS — J309 Allergic rhinitis, unspecified: Secondary | ICD-10-CM | POA: Diagnosis not present

## 2023-04-08 MED ORDER — EPINEPHRINE 0.3 MG/0.3ML IJ SOAJ: 0.3000 mg | INTRAMUSCULAR | Status: AC | PRN

## 2023-04-08 NOTE — Telephone Encounter (Signed)
 Pt uses [facility] in [place]- sent Epipen rx and called and pharmacist says her insurance will not cover Epipen only AuviQ- I verbally gave order for AuviQ. Pt aware of change.

## 2023-04-14 ENCOUNTER — Ambulatory Visit (INDEPENDENT_AMBULATORY_CARE_PROVIDER_SITE_OTHER): Payer: BC Managed Care – PPO

## 2023-04-14 DIAGNOSIS — J309 Allergic rhinitis, unspecified: Secondary | ICD-10-CM | POA: Diagnosis not present

## 2023-04-22 ENCOUNTER — Ambulatory Visit (INDEPENDENT_AMBULATORY_CARE_PROVIDER_SITE_OTHER): Payer: Self-pay

## 2023-04-22 DIAGNOSIS — J309 Allergic rhinitis, unspecified: Secondary | ICD-10-CM

## 2023-05-02 ENCOUNTER — Ambulatory Visit (INDEPENDENT_AMBULATORY_CARE_PROVIDER_SITE_OTHER): Payer: Self-pay

## 2023-05-02 DIAGNOSIS — J309 Allergic rhinitis, unspecified: Secondary | ICD-10-CM

## 2023-05-02 MED ORDER — EPINEPHRINE 0.3 MG/0.3ML IJ SOAJ: 0.3000 mg | Freq: Once | INTRAMUSCULAR | 0 refills | Status: AC

## 2023-05-07 ENCOUNTER — Telehealth: Payer: Self-pay | Admitting: Family Medicine

## 2023-05-07 NOTE — Telephone Encounter (Signed)
 error

## 2023-05-09 ENCOUNTER — Ambulatory Visit (INDEPENDENT_AMBULATORY_CARE_PROVIDER_SITE_OTHER): Payer: Self-pay

## 2023-05-09 DIAGNOSIS — J309 Allergic rhinitis, unspecified: Secondary | ICD-10-CM | POA: Diagnosis not present

## 2023-05-16 ENCOUNTER — Ambulatory Visit (INDEPENDENT_AMBULATORY_CARE_PROVIDER_SITE_OTHER): Payer: Self-pay

## 2023-05-16 DIAGNOSIS — J309 Allergic rhinitis, unspecified: Secondary | ICD-10-CM | POA: Diagnosis not present

## 2023-05-23 ENCOUNTER — Ambulatory Visit (INDEPENDENT_AMBULATORY_CARE_PROVIDER_SITE_OTHER): Payer: Self-pay

## 2023-05-23 DIAGNOSIS — J309 Allergic rhinitis, unspecified: Secondary | ICD-10-CM | POA: Diagnosis not present

## 2023-05-31 ENCOUNTER — Ambulatory Visit (INDEPENDENT_AMBULATORY_CARE_PROVIDER_SITE_OTHER): Payer: Self-pay

## 2023-05-31 DIAGNOSIS — J309 Allergic rhinitis, unspecified: Secondary | ICD-10-CM | POA: Diagnosis not present

## 2023-05-31 DIAGNOSIS — L6681 Central centrifugal cicatricial alopecia: Secondary | ICD-10-CM | POA: Diagnosis not present

## 2023-06-06 ENCOUNTER — Ambulatory Visit (INDEPENDENT_AMBULATORY_CARE_PROVIDER_SITE_OTHER): Payer: Self-pay

## 2023-06-06 DIAGNOSIS — J309 Allergic rhinitis, unspecified: Secondary | ICD-10-CM

## 2023-06-14 ENCOUNTER — Ambulatory Visit (INDEPENDENT_AMBULATORY_CARE_PROVIDER_SITE_OTHER): Payer: Self-pay

## 2023-06-14 DIAGNOSIS — J309 Allergic rhinitis, unspecified: Secondary | ICD-10-CM | POA: Diagnosis not present

## 2023-07-08 ENCOUNTER — Ambulatory Visit (INDEPENDENT_AMBULATORY_CARE_PROVIDER_SITE_OTHER)

## 2023-07-08 DIAGNOSIS — J309 Allergic rhinitis, unspecified: Secondary | ICD-10-CM

## 2023-07-20 ENCOUNTER — Ambulatory Visit (INDEPENDENT_AMBULATORY_CARE_PROVIDER_SITE_OTHER): Payer: Self-pay

## 2023-07-20 DIAGNOSIS — J309 Allergic rhinitis, unspecified: Secondary | ICD-10-CM | POA: Diagnosis not present

## 2023-07-29 ENCOUNTER — Ambulatory Visit (INDEPENDENT_AMBULATORY_CARE_PROVIDER_SITE_OTHER): Payer: Self-pay

## 2023-07-29 DIAGNOSIS — J309 Allergic rhinitis, unspecified: Secondary | ICD-10-CM | POA: Diagnosis not present

## 2023-08-04 ENCOUNTER — Ambulatory Visit (INDEPENDENT_AMBULATORY_CARE_PROVIDER_SITE_OTHER): Payer: Self-pay

## 2023-08-04 DIAGNOSIS — J309 Allergic rhinitis, unspecified: Secondary | ICD-10-CM | POA: Diagnosis not present

## 2023-08-10 ENCOUNTER — Encounter: Payer: Self-pay | Admitting: Internal Medicine

## 2023-08-10 ENCOUNTER — Ambulatory Visit: Payer: BC Managed Care – PPO | Admitting: Internal Medicine

## 2023-08-10 VITALS — BP 118/80 | HR 88 | Temp 97.5°F | Wt 161.9 lb

## 2023-08-10 DIAGNOSIS — J302 Other seasonal allergic rhinitis: Secondary | ICD-10-CM | POA: Diagnosis not present

## 2023-08-10 DIAGNOSIS — L253 Unspecified contact dermatitis due to other chemical products: Secondary | ICD-10-CM | POA: Diagnosis not present

## 2023-08-10 DIAGNOSIS — J3089 Other allergic rhinitis: Secondary | ICD-10-CM

## 2023-08-10 DIAGNOSIS — K219 Gastro-esophageal reflux disease without esophagitis: Secondary | ICD-10-CM | POA: Diagnosis not present

## 2023-08-10 MED ORDER — OMEPRAZOLE 40 MG PO CPDR: 40.0000 mg | DELAYED_RELEASE_CAPSULE | Freq: Every day | ORAL | 0 refills | Status: AC

## 2023-08-10 MED ORDER — AZELASTINE HCL 0.05 % OP SOLN: 1.0000 [drp] | Freq: Two times a day (BID) | OPHTHALMIC | 5 refills | Status: AC | PRN

## 2023-08-10 MED ORDER — MONTELUKAST SODIUM 10 MG PO TABS: 10.0000 mg | ORAL_TABLET | Freq: Every day | ORAL | 5 refills | Status: AC

## 2023-08-10 MED ORDER — RYALTRIS 665-25 MCG/ACT NA SUSP: 2.0000 | Freq: Two times a day (BID) | NASAL | 5 refills | Status: AC | PRN

## 2023-08-10 NOTE — Progress Notes (Signed)
 FOLLOW UP Date of Service/Encounter:  08/10/23  Subjective:  Morgan Sanchez (DOB: May 21, 1990) is a 33 y.o. female who [facility] in re-evaluation of the following: Allergic rhinitis and recurrent sinusitis, GERD, contact dermatitis from cherry Chapstick History obtained from: chart review and patient.  For Review, LV was on 02/09/23  with Dr.Kyani Simkin seen for allergy  testing. See below for summary of history and diagnostics.   Therapeutic plans/changes recommended: Recommended starting allergy  injections. ----------------------------------------------------- Pertinent History/Diagnostics:  Recurrent Sinusitis Seasonal perennial allergic rhinitis Frequent sinus infections requiring antibiotics, currently on Amoxicillin  (2 rounds in last year). Nasal congestion, pressure in the head, lightheadedness, and fatigue reported. No significant findings on ENT examination per report. Possible environmental triggers (new house, dog, potential mold exposure). -Allergy  testing 02/09/2023 positive to grass pollen, weed pollen, indoor/outdoor molds, dust mites, cat, dog, mixed feathers - Plan: INCS, Daily AH, Singulair , allergy  eyedrops as needed -AIT started 03/04/2023.  Has not yet reached maintenance.  She receives 1 vial containing grasses weeds and dog and another vial containing mold dust mite and cat. Gastroesophageal Reflux Disease (GERD) Reports symptoms with spicy food intake, controlled with diet. Contact dermatitis following cherry Chapstick use - Avoidance recommended --------------------------------------------------- Today presents for follow-up. Discussed the use of AI scribe software for clinical note transcription with the patient, who gave verbal consent to proceed.  History of Present Illness   Morgan Sanchez is a 33 year old female who presents for follow-up on allergy  injections.  She is currently undergoing allergen immunotherapy,  having completed the blue and gold vials, and is now starting the green vial. She experiences itching as a side effect of the injections but has not noticed any improvement in her symptoms yet. She tolerates the injections well. No recent sinus infections, eye irritation, or issues with cherry chapstick.  She is taking over-the-counter allergy  medication but is not using Singulair  (montelukast ) as she was unaware it was prescribed. She has difficulty using nasal flushes due to ear pain from mucus congestion and previously used Flonase without noticing a difference.  She experiences increased mucus production after consuming dairy. She does not carry an EpiPen  to appointments but has one at home.  She manages her reflux by avoiding trigger foods, but recent dental feedback indicates acid is affecting her teeth. She has not been using any medication for reflux.      All medications reviewed by clinical staff and updated in chart. No new pertinent medical or surgical history except as noted in HPI.  ROS: All others negative except as noted per HPI.   Objective:  BP 118/80 (BP Location: Left Arm, Patient Position: Sitting, Cuff Size: Normal)   Pulse 88   Temp (!) 97.5 F (36.4 C) (Temporal)   Wt 161 lb 14.4 oz (73.4 kg)   SpO2 100%   BMI 29.61 kg/m  Body mass index is 29.61 kg/m. Physical Exam: General Appearance:  Alert, cooperative, no distress, appears stated age  Head:  Normocephalic, without obvious abnormality, atraumatic  Eyes:  Conjunctiva clear, EOM's intact  Ears EACs normal bilaterally and normal TMs bilaterally  Nose: Nares normal, hypertrophic turbinates, normal mucosa, and no visible anterior polyps  Throat: Lips, tongue normal; teeth and gums normal, normal posterior oropharynx  Neck: Supple, symmetrical  Lungs:   clear to auscultation bilaterally, Respirations unlabored, no coughing  Heart:  regular rate and rhythm and no murmur, Appears well perfused  Extremities: No  edema  Skin: Skin color, texture, turgor normal  and no rashes or lesions on visualized portions of skin  Neurologic: No gross deficits   Labs:  Lab Orders  No laboratory test(s) ordered today    Assessment/Plan   Recurrent Sinusitis-not yet at goal, continues in AIT build-up Seasonal perennial allergic rhinitis Frequent sinus infections requiring antibiotics, currently on Amoxicillin  (2 rounds in last year). Nasal congestion, pressure in the head, lightheadedness, and fatigue reported. No significant findings on ENT examination per report. Possible environmental triggers (new house, dog, potential mold exposure). Interval hx: Started on allergy  injections, not yet to maintenance.  Not noticing significant improvement.  Has not started Singulair . -Allergy  testing 02/09/23 positive to grass pollen, weed pollen, indoor/outdoor molds, dust mites, cat, dog, mixed feathers -consider nasal saline spray for symptom relief and to maintain nasal moisture. -Start Ryaltris 2 sprays in each nostril twice daily as needed. -send to send-out pharmacy [facility] in [place]. -start a daily non-sedating anthistamine: Your options include: Zyrtec (cetirizine) 10 mg, Claritin (loratadine) 10 mg, Xyzal (levocetirizine) 5 mg or Allegra (fexofenadine) 180 mg daily as needed - start singulair  10 mg nightly-stop if nightmares or behavior changes - consider over the counter allergy  eye drops-optivar 1 drop in each eye twice daily as needed.  - continue allergy  Injections per protocol, bring your epipen  to your injection appointments.   Gastroesophageal Reflux Disease (GERD)-not at goal Reports symptoms with spicy food intake, controlled with diet. Notable history: No obvious heartburn symptoms, but was told by dentist that acid is affecting her teeth. -Continue dietary management. - add omeprazole 40 mg daily- take on an empty stomach. Wait 30 minutes prior to eating.  Contact dermatitis following  cherry Chapstick use - Avoidance recommended  Follow-up 6 months, sooner if needed   It was a pleasure seeing you again in clinic today! Thank you for allowing me to participate in your care.  Other: Ryaltris, allergy  injection given in clinic today.  Jonathon Neighbors, MD  [facility] 

## 2023-08-10 NOTE — Patient Instructions (Addendum)
 Recurrent Sinusitis Seasonal perennial allergic rhinitis Frequent sinus infections requiring antibiotics, currently on Amoxicillin  (2 rounds in last year). Nasal congestion, pressure in the head, lightheadedness, and fatigue reported. No significant findings on ENT examination per report. Possible environmental triggers (new house, dog, potential mold exposure). Interval hx: Started on allergy  injections, not yet to maintenance.  Not noticing significant improvement.  Has not started Singulair . -Allergy  testing 02/09/23 positive to grass pollen, weed pollen, indoor/outdoor molds, dust mites, cat, dog, mixed feathers -consider nasal saline spray for symptom relief and to maintain nasal moisture. -Start Ryaltris 2 sprays in each nostril twice daily as needed. -send to send-out pharmacy [facility] in [place]. -start a daily non-sedating anthistamine: Your options include: Zyrtec (cetirizine) 10 mg, Claritin (loratadine) 10 mg, Xyzal (levocetirizine) 5 mg or Allegra (fexofenadine) 180 mg daily as needed - start singulair  10 mg nightly-stop if nightmares or behavior changes - consider over the counter allergy  eye drops-optivar 1 drop in each eye twice daily as needed.  - continue allergy  Injections per protocol, bring your epipen  to your injection appointments.   Gastroesophageal Reflux Disease (GERD) Reports symptoms with spicy food intake, controlled with diet. Notable history: No obvious heartburn symptoms, but was told by dentist that acid is affecting her teeth. -Continue dietary management. - add omeprazole 40 mg daily- take on an empty stomach. Wait 30 minutes prior to eating.  Contact dermatitis following cherry Chapstick use - Avoidance recommended  Follow-up 6 months, sooner if needed   It was a pleasure seeing you again in clinic today! Thank you for allowing me to participate in your care.  Jonathon Neighbors, MD [facility]  Reducing Pollen Exposure  The  American Academy of Allergy , Asthma and Immunology suggests the following steps to reduce your exposure to pollen during allergy  seasons.    Do not hang sheets or clothing out to dry; pollen may collect on these items. Do not mow lawns or spend time around freshly cut grass; mowing stirs up pollen. Keep windows closed at night.  Keep car windows closed while driving. Minimize morning activities outdoors, a time when pollen counts are usually at their highest. Stay indoors as much as possible when pollen counts or humidity is high and on windy days when pollen tends to remain in the air longer. Use air conditioning when possible.  Many air conditioners have filters that trap the pollen spores. Use a HEPA room air filter to remove pollen form the indoor air you breathe. Control of Mold Allergen   Mold and fungi can grow on a variety of surfaces provided certain temperature and moisture conditions exist.  Outdoor molds grow on plants, decaying vegetation and soil.  The major outdoor mold, Alternaria and Cladosporium, are found in very high numbers during hot and dry conditions.  Generally, a late Summer - Fall peak is seen for common outdoor fungal spores.  Rain will temporarily lower outdoor mold spore count, but counts rise rapidly when the rainy period ends.  The most important indoor molds are Aspergillus and Penicillium.  Dark, humid and poorly ventilated basements are ideal sites for mold growth.  The next most common sites of mold growth are the bathroom and the kitchen.  Outdoor (Seasonal) Mold Control  Use air conditioning and keep windows closed Avoid exposure to decaying vegetation. Avoid leaf raking. Avoid grain handling. Consider wearing a face mask if working in moldy areas.    Indoor (Perennial) Mold Control   Maintain humidity below 50%. Clean washable  surfaces with 5% bleach solution. Remove sources e.g. contaminated carpets.   DUST MITE AVOIDANCE MEASURES:  There are  three main measures that need and can be taken to avoid house dust mites:  Reduce accumulation of dust in general -reduce furniture, clothing, carpeting, books, stuffed animals, especially in bedroom  Separate yourself from the dust -use pillow and mattress encasements (can be found at stores such as [employer], [employer] or online) -avoid direct exposure to air condition flow -use a HEPA filter device, especially in the bedroom; you can also use a HEPA filter vacuum cleaner -wipe dust with a moist towel instead of a dry towel or broom when cleaning  Decrease mites and/or their secretions -wash clothing and linen and stuffed animals at highest temperature possible, at least every 2 weeks -stuffed animals can also be placed in a bag and put in a freezer overnight  Despite the above measures, it is impossible to eliminate dust mites or their allergen completely from your home.  With the above measures the burden of mites in your home can be diminished, with the goal of minimizing your allergic symptoms.  Success will be reached only when implementing and using all means together. Control of Dog or Cat Allergen  Avoidance is the best way to manage a dog or cat allergy . If you have a dog or cat and are allergic to dog or cats, consider removing the dog or cat from the home. If you have a dog or cat but don't want to find it a new home, or if your family wants a pet even though someone in the household is allergic, here are some strategies that may help keep symptoms at bay:  Keep the pet out of your bedroom and restrict it to only a few rooms. Be advised that keeping the dog or cat in only one room will not limit the allergens to that room. Don't pet, hug or kiss the dog or cat; if you do, wash your hands with soap and water. High-efficiency particulate air (HEPA) cleaners run continuously in a bedroom or living room can reduce allergen levels over time. Regular use of a high-efficiency vacuum  cleaner or a central vacuum can reduce allergen levels. Giving your dog or cat a bath at least once a week can reduce airborne allergen.

## 2023-08-18 ENCOUNTER — Ambulatory Visit (INDEPENDENT_AMBULATORY_CARE_PROVIDER_SITE_OTHER)

## 2023-08-18 ENCOUNTER — Other Ambulatory Visit: Payer: Self-pay

## 2023-08-18 DIAGNOSIS — J309 Allergic rhinitis, unspecified: Secondary | ICD-10-CM

## 2023-08-18 MED ORDER — AZELASTINE-FLUTICASONE 137-50 MCG/ACT NA SUSP: 1.0000 | Freq: Two times a day (BID) | NASAL | 3 refills | Status: AC | PRN

## 2023-08-31 ENCOUNTER — Ambulatory Visit (INDEPENDENT_AMBULATORY_CARE_PROVIDER_SITE_OTHER): Payer: Self-pay

## 2023-08-31 DIAGNOSIS — J309 Allergic rhinitis, unspecified: Secondary | ICD-10-CM | POA: Diagnosis not present

## 2023-09-06 ENCOUNTER — Ambulatory Visit (INDEPENDENT_AMBULATORY_CARE_PROVIDER_SITE_OTHER): Payer: Self-pay

## 2023-09-06 DIAGNOSIS — J309 Allergic rhinitis, unspecified: Secondary | ICD-10-CM

## 2023-09-21 ENCOUNTER — Ambulatory Visit (INDEPENDENT_AMBULATORY_CARE_PROVIDER_SITE_OTHER): Payer: Self-pay

## 2023-09-21 DIAGNOSIS — J309 Allergic rhinitis, unspecified: Secondary | ICD-10-CM

## 2023-09-27 ENCOUNTER — Ambulatory Visit (INDEPENDENT_AMBULATORY_CARE_PROVIDER_SITE_OTHER): Payer: Self-pay

## 2023-09-27 DIAGNOSIS — J309 Allergic rhinitis, unspecified: Secondary | ICD-10-CM | POA: Diagnosis not present

## 2023-10-06 ENCOUNTER — Ambulatory Visit (INDEPENDENT_AMBULATORY_CARE_PROVIDER_SITE_OTHER): Payer: Self-pay | Admitting: *Deleted

## 2023-10-06 DIAGNOSIS — J309 Allergic rhinitis, unspecified: Secondary | ICD-10-CM

## 2023-10-13 ENCOUNTER — Ambulatory Visit (INDEPENDENT_AMBULATORY_CARE_PROVIDER_SITE_OTHER): Payer: Self-pay

## 2023-10-13 DIAGNOSIS — J309 Allergic rhinitis, unspecified: Secondary | ICD-10-CM | POA: Diagnosis not present

## 2023-10-20 ENCOUNTER — Ambulatory Visit (INDEPENDENT_AMBULATORY_CARE_PROVIDER_SITE_OTHER): Payer: Self-pay

## 2023-10-20 DIAGNOSIS — J309 Allergic rhinitis, unspecified: Secondary | ICD-10-CM | POA: Diagnosis not present

## 2023-10-24 DIAGNOSIS — Z0000 Encounter for general adult medical examination without abnormal findings: Secondary | ICD-10-CM | POA: Diagnosis not present

## 2023-10-24 DIAGNOSIS — R5383 Other fatigue: Secondary | ICD-10-CM | POA: Diagnosis not present

## 2023-10-24 DIAGNOSIS — R718 Other abnormality of red blood cells: Secondary | ICD-10-CM | POA: Diagnosis not present

## 2023-11-11 ENCOUNTER — Ambulatory Visit (INDEPENDENT_AMBULATORY_CARE_PROVIDER_SITE_OTHER): Payer: Self-pay

## 2023-11-11 DIAGNOSIS — J309 Allergic rhinitis, unspecified: Secondary | ICD-10-CM | POA: Diagnosis not present

## 2023-12-02 ENCOUNTER — Telehealth: Payer: Self-pay | Admitting: Internal Medicine

## 2023-12-02 NOTE — Telephone Encounter (Signed)
 Information given to Demita to transfer vail from [facility] to [facility]

## 2023-12-02 NOTE — Telephone Encounter (Signed)
 Patient called stating she would like her Vials transferred from the [facility] office to the [facility] office.

## 2024-02-15 ENCOUNTER — Ambulatory Visit: Admitting: Internal Medicine

## 2024-03-07 ENCOUNTER — Ambulatory Visit: Admitting: Internal Medicine
